# Patient Record
Sex: Female | Born: 1980 | Race: White | Hispanic: No | Marital: Married | Smoking: Never smoker
Health system: Southern US, Community
[De-identification: ages and names within clinical notes are randomized; demographics above are authoritative.]

## PROBLEM LIST (undated history)

## (undated) DIAGNOSIS — E042 Nontoxic multinodular goiter: Secondary | ICD-10-CM

## (undated) DIAGNOSIS — F419 Anxiety disorder, unspecified: Secondary | ICD-10-CM

## (undated) HISTORY — DX: Anxiety disorder, unspecified: F41.9

## (undated) HISTORY — PX: BIOPSY THYROID: PRO38

---

## 2002-11-19 ENCOUNTER — Encounter: Admission: RE | Admit: 2002-11-19 | Discharge: 2002-11-19 | Payer: Self-pay | Admitting: Internal Medicine

## 2002-11-19 ENCOUNTER — Encounter: Payer: Self-pay | Admitting: Internal Medicine

## 2014-07-09 ENCOUNTER — Encounter (HOSPITAL_BASED_OUTPATIENT_CLINIC_OR_DEPARTMENT_OTHER): Payer: Self-pay

## 2014-07-09 ENCOUNTER — Emergency Department (HOSPITAL_BASED_OUTPATIENT_CLINIC_OR_DEPARTMENT_OTHER): Payer: BC Managed Care – PPO

## 2014-07-09 ENCOUNTER — Emergency Department (HOSPITAL_BASED_OUTPATIENT_CLINIC_OR_DEPARTMENT_OTHER)
Admission: EM | Admit: 2014-07-09 | Discharge: 2014-07-09 | Disposition: A | Discharge status: Home / Self Care | Payer: BC Managed Care – PPO | Attending: Emergency Medicine | Admitting: Emergency Medicine

## 2014-07-09 DIAGNOSIS — Z3202 Encounter for pregnancy test, result negative: Secondary | ICD-10-CM | POA: Insufficient documentation

## 2014-07-09 DIAGNOSIS — R52 Pain, unspecified: Secondary | ICD-10-CM

## 2014-07-09 DIAGNOSIS — M79605 Pain in left leg: Secondary | ICD-10-CM

## 2014-07-09 DIAGNOSIS — Z8639 Personal history of other endocrine, nutritional and metabolic disease: Secondary | ICD-10-CM | POA: Diagnosis not present

## 2014-07-09 DIAGNOSIS — R202 Paresthesia of skin: Secondary | ICD-10-CM | POA: Insufficient documentation

## 2014-07-09 HISTORY — DX: Nontoxic multinodular goiter: E04.2

## 2014-07-09 LAB — BASIC METABOLIC PANEL
ANION GAP: 0 — AB (ref 5–15)
BUN: 9 mg/dL (ref 6–23)
CHLORIDE: 107 mmol/L (ref 96–112)
CO2: 28 mmol/L (ref 19–32)
CREATININE: 0.54 mg/dL (ref 0.50–1.10)
Calcium: 9 mg/dL (ref 8.4–10.5)
GFR calc Af Amer: >90 mL/min (ref >90)
GFR calc non Af Amer: >90 mL/min (ref >90)
GLUCOSE: 102 mg/dL — AB (ref 70–99)
Potassium: 4 mmol/L (ref 3.5–5.1)
Sodium: 135 mmol/L (ref 135–145)

## 2014-07-09 LAB — CBC WITH DIFFERENTIAL/PLATELET
BASOS ABS: 0 10*3/uL (ref 0.0–0.1)
BASOS PCT: 0 % (ref 0–1)
Eosinophils Absolute: 0.1 10*3/uL (ref 0.0–0.7)
Eosinophils Relative: 1 % (ref 0–5)
HCT: 39.8 % (ref 36.0–46.0)
Hemoglobin: 13.4 g/dL (ref 12.0–15.0)
Lymphocytes Relative: 20 % (ref 12–46)
Lymphs Abs: 0.9 10*3/uL (ref 0.7–4.0)
MCH: 30.2 pg (ref 26.0–34.0)
MCHC: 33.7 g/dL (ref 30.0–36.0)
MCV: 89.6 fL (ref 78.0–100.0)
Monocytes Absolute: 0.3 10*3/uL (ref 0.1–1.0)
Monocytes Relative: 7 % (ref 3–12)
NEUTROS ABS: 3.2 10*3/uL (ref 1.7–7.7)
NEUTROS PCT: 72 % (ref 43–77)
Platelets: 153 10*3/uL (ref 150–400)
RBC: 4.44 MIL/uL (ref 3.87–5.11)
RDW: 12.3 % (ref 11.5–15.5)
WBC: 4.5 10*3/uL (ref 4.0–10.5)

## 2014-07-09 LAB — URINALYSIS, ROUTINE W REFLEX MICROSCOPIC
Bilirubin Urine: NEGATIVE
GLUCOSE, UA: NEGATIVE mg/dL
Hgb urine dipstick: NEGATIVE
KETONES UR: NEGATIVE mg/dL
LEUKOCYTES UA: NEGATIVE
Nitrite: NEGATIVE
PH: 7 (ref 5.0–8.0)
Protein, ur: NEGATIVE mg/dL
Specific Gravity, Urine: 1.005 (ref 1.005–1.030)
Urobilinogen, UA: 0.2 mg/dL (ref 0.0–1.0)

## 2014-07-09 LAB — PREGNANCY, URINE: PREG TEST UR: NEGATIVE

## 2014-07-09 NOTE — ED Notes (Signed)
Patient transported to Ultrasound 

## 2014-07-09 NOTE — ED Notes (Signed)
Left leg pain and sensation of swelling in posterior thigh x 1 week, worsening the past 2 days. Travels 2 hours in the car daily. Denies SHOB.

## 2014-07-09 NOTE — ED Provider Notes (Signed)
CSN: [id number]     Arrival date & time 07/09/14  [id number] History   First MD Initiated Contact with Patient 07/09/14 [phone]     Chief Complaint  Patient presents with  . Leg Pain    HPI For the last week she has felt that the left leg feels different.  Not really painful.  Since yesterday she has felt that her leg is swollen.  It did not look much different but it felt swollen.  She also noticed a tingling sensation. No weakness.  No fevers.  No chest pain or shortness of breath.  She has been traveling a lot in the car and is concerned about the possibility of a blood clot.  She called her doctor who thought she needed an ultrasound or blood tests but could not see her until 6pm. Past Medical History  Diagnosis Date  . Multiple thyroid nodules    Past Surgical History  Procedure Laterality Date  . Biopsy thyroid     No family history on file. History  Substance Use Topics  . Smoking status: Never Smoker   . Smokeless tobacco: Not on file  . Alcohol Use: No   OB History    No data available     Review of Systems  All other systems reviewed and are negative.     Allergies  Erythromycin  Home Medications   Prior to Admission medications   Medication Sig Start Date End Date Taking? Authorizing Provider  Thyroid (NATURE-THROID PO) Take by mouth.   Yes Historical Provider, MD   BP 120/68 mmHg  Pulse 72  Temp(Src) 98.2 F (36.8 C) (Oral)  Resp 18  Ht 5\' 2"  (1.575 m)  Wt 127 lb (57.607 kg)  BMI 23.22 kg/m2  SpO2 100%  LMP 06/25/2014 Physical Exam  Constitutional: She appears well-developed and well-nourished. No distress.  HENT:  Head: Normocephalic and atraumatic.  Right Ear: External ear normal.  Left Ear: External ear normal.  Eyes: Conjunctivae are normal. Right eye exhibits no discharge. Left eye exhibits no discharge. No scleral icterus.  Neck: Neck supple. No tracheal deviation present.  Cardiovascular: Normal rate.   Pulmonary/Chest: Effort normal. No stridor.  No respiratory distress.  Musculoskeletal: She exhibits no edema.       Lumbar back: Normal.  Neurological: She is alert. Cranial nerve deficit: no gross deficits.  Skin: Skin is warm and dry. No rash noted.  Psychiatric: She has a normal mood and affect.  Nursing note and vitals reviewed.   ED Course  Procedures (including critical care time) Labs Review Labs Reviewed  BASIC METABOLIC PANEL - Abnormal; Notable for the following:    Glucose, Bld 102 (*)    Anion gap 0 (*)    All other components within normal limits  CBC WITH DIFFERENTIAL/PLATELET  URINALYSIS, ROUTINE W REFLEX MICROSCOPIC  PREGNANCY, URINE    Imaging Review [place] Venous Img Lower Unilateral Left  07/09/2014   CLINICAL DATA:  Discomfort of the left posterior thigh and left calf with some swelling. Symptoms of 1 week duration. Frequent traveling by car.  EXAM: Left LOWER EXTREMITY VENOUS DOPPLER ULTRASOUND  TECHNIQUE: Gray-scale sonography with graded compression, as well as color Doppler and duplex ultrasound were performed to evaluate the lower extremity deep venous systems from the level of the common femoral vein and including the common femoral, femoral, profunda femoral, popliteal and calf veins including the posterior tibial, peroneal and gastrocnemius veins when visible. The superficial great saphenous vein was also interrogated. Spectral Doppler was  utilized to evaluate flow at rest and with distal augmentation maneuvers in the common femoral, femoral and popliteal veins.  COMPARISON:  None.  FINDINGS: Contralateral Common Femoral Vein: Respiratory phasicity is normal and symmetric with the symptomatic side. No evidence of thrombus. Normal compressibility.  Common Femoral Vein: No evidence of thrombus. Normal compressibility, respiratory phasicity and response to augmentation.  Saphenofemoral Junction: No evidence of thrombus. Normal compressibility and flow on color Doppler imaging.  Profunda Femoral Vein: No evidence of  thrombus. Normal compressibility and flow on color Doppler imaging.  Femoral Vein: No evidence of thrombus. Normal compressibility, respiratory phasicity and response to augmentation.  Popliteal Vein: No evidence of thrombus. Normal compressibility, respiratory phasicity and response to augmentation.  Calf Veins: No evidence of thrombus. Normal compressibility and flow on color Doppler imaging.  Superficial Great Saphenous Vein: No evidence of thrombus. Normal compressibility and flow on color Doppler imaging.  Venous Reflux:  None.  Other Findings: Survey of the soft tissues of the posterior thigh do not show any abnormal finding.  IMPRESSION: No evidence of deep venous thrombosis.   Electronically Signed   By: Paulina Fusi M.D.   On: 07/09/2014 10:29     MDM   Final diagnoses:  Pain  Pain of left lower extremity    Symptoms may be radicular.  Overall they are very mild.  Pt is reassured that she does not have a DVT.  Follow up with PCP prn   Linwood Dibbles, MD 07/09/14 1240

## 2014-07-09 NOTE — ED Notes (Signed)
Patient returns from Ultrasound. 

## 2014-07-23 ENCOUNTER — Emergency Department (HOSPITAL_BASED_OUTPATIENT_CLINIC_OR_DEPARTMENT_OTHER)
Admission: EM | Admit: 2014-07-23 | Discharge: 2014-07-23 | Disposition: A | Discharge status: Home / Self Care | Payer: BC Managed Care – PPO | Attending: Emergency Medicine | Admitting: Emergency Medicine

## 2014-07-23 ENCOUNTER — Other Ambulatory Visit: Payer: Self-pay

## 2014-07-23 ENCOUNTER — Encounter (HOSPITAL_BASED_OUTPATIENT_CLINIC_OR_DEPARTMENT_OTHER): Payer: Self-pay

## 2014-07-23 ENCOUNTER — Emergency Department (HOSPITAL_BASED_OUTPATIENT_CLINIC_OR_DEPARTMENT_OTHER): Payer: BC Managed Care – PPO

## 2014-07-23 DIAGNOSIS — R079 Chest pain, unspecified: Secondary | ICD-10-CM | POA: Insufficient documentation

## 2014-07-23 DIAGNOSIS — R000 Tachycardia, unspecified: Secondary | ICD-10-CM | POA: Insufficient documentation

## 2014-07-23 DIAGNOSIS — E042 Nontoxic multinodular goiter: Secondary | ICD-10-CM | POA: Diagnosis not present

## 2014-07-23 LAB — COMPREHENSIVE METABOLIC PANEL
ALBUMIN: 4.7 g/dL (ref 3.5–5.2)
ALT: 23 U/L (ref 0–35)
AST: 34 U/L (ref 0–37)
Alkaline Phosphatase: 50 U/L (ref 39–117)
Anion gap: 9 (ref 5–15)
BILIRUBIN TOTAL: 0.7 mg/dL (ref 0.3–1.2)
BUN: 6 mg/dL (ref 6–23)
CHLORIDE: 105 mmol/L (ref 96–112)
CO2: 26 mmol/L (ref 19–32)
CREATININE: 0.63 mg/dL (ref 0.50–1.10)
Calcium: 9.6 mg/dL (ref 8.4–10.5)
GFR calc Af Amer: >90 mL/min (ref >90)
GFR calc non Af Amer: >90 mL/min (ref >90)
Glucose, Bld: 121 mg/dL — ABNORMAL HIGH (ref 70–99)
POTASSIUM: 4.5 mmol/L (ref 3.5–5.1)
SODIUM: 140 mmol/L (ref 135–145)
Total Protein: 7.1 g/dL (ref 6.0–8.3)

## 2014-07-23 LAB — LIPASE, BLOOD: Lipase: 42 U/L (ref 11–59)

## 2014-07-23 LAB — CBC WITH DIFFERENTIAL/PLATELET
Basophils Absolute: 0 10*3/uL (ref 0.0–0.1)
Basophils Relative: 0 % (ref 0–1)
EOS ABS: 0 10*3/uL (ref 0.0–0.7)
Eosinophils Relative: 0 % (ref 0–5)
HEMATOCRIT: 38.5 % (ref 36.0–46.0)
HEMOGLOBIN: 13.1 g/dL (ref 12.0–15.0)
LYMPHS ABS: 0.7 10*3/uL (ref 0.7–4.0)
Lymphocytes Relative: 11 % — ABNORMAL LOW (ref 12–46)
MCH: 30.8 pg (ref 26.0–34.0)
MCHC: 34 g/dL (ref 30.0–36.0)
MCV: 90.4 fL (ref 78.0–100.0)
MONOS PCT: 5 % (ref 3–12)
Monocytes Absolute: 0.3 10*3/uL (ref 0.1–1.0)
NEUTROS PCT: 84 % — AB (ref 43–77)
Neutro Abs: 5.3 10*3/uL (ref 1.7–7.7)
Platelets: 191 10*3/uL (ref 150–400)
RBC: 4.26 MIL/uL (ref 3.87–5.11)
RDW: 12.7 % (ref 11.5–15.5)
WBC: 6.3 10*3/uL (ref 4.0–10.5)

## 2014-07-23 LAB — TROPONIN I

## 2014-07-23 MED ORDER — IOHEXOL 350 MG/ML SOLN
100.0000 mL | Freq: Once | INTRAVENOUS | Status: AC | PRN
  Administered 2014-07-23: 100 mL via INTRAVENOUS

## 2014-07-23 NOTE — ED Notes (Signed)
Pt has taken 81mg  ASA x 2

## 2014-07-23 NOTE — ED Notes (Signed)
Pt reports left sided chest pain. Also sts radiation to left shoulder and in back.

## 2014-07-23 NOTE — ED Provider Notes (Signed)
CSN: [id number]     Arrival date & time 07/23/14  1619 History   First MD Initiated Contact with Patient 07/23/14 1809     Chief Complaint  Patient presents with  . Chest Pain     (Consider location/radiation/quality/duration/timing/severity/associated sxs/prior Treatment) HPI Anne Spencer a 34 year old female who began having some pain under her left breast around noon today. Then began radiating around to behind under her left shoulder blade. She is Anne Spencer describes it as a pressure type pain. His been constant with some times when it worsens and decreases. Appears to worsen with movement. She took aspirin at home without a significant relief. She had some mild dyspnea with deep inspiration. She was seen several weeks ago because she had some swelling in her left lower extremity, however she had a Doppler at that time that did not show any evidence of DVT. She is not currently on any hormones. She has no history of DVT or PE. She does have a family history of coronary artery disease. Not had any fever, nausea, vomiting, chest, or abdominal trauma. Past Medical History  Diagnosis Date  . Multiple thyroid nodules    Past Surgical History  Procedure Laterality Date  . Biopsy thyroid     No family history on file. History  Substance Use Topics  . Smoking status: Never Smoker   . Smokeless tobacco: Not on file  . Alcohol Use: No   OB History    No data available     Review of Systems  All other systems reviewed and are negative.     Allergies  Erythromycin  Home Medications   Prior to Admission medications   Medication Sig Start Date End Date Taking? Authorizing Provider  Thyroid (NATURE-THROID PO) Take by mouth.    Historical Provider, MD   BP 109/67 mmHg  Pulse 93  Temp(Src) 98.5 F (36.9 C) (Oral)  Resp 20  Ht 5\' 2"  (1.575 m)  Wt 125 lb (56.7 kg)  BMI 22.86 kg/m2  SpO2 100%  LMP 07/23/2014 Physical Exam  Constitutional: She is oriented to person, place, and time. She  appears well-developed and well-nourished.  HENT:  Head: Normocephalic and atraumatic.  Right Ear: External ear normal.  Left Ear: External ear normal.  Nose: Nose normal.  Mouth/Throat: Oropharynx is clear and moist.  Eyes: Conjunctivae and EOM are normal. Pupils are equal, round, and reactive to light.  Neck: Normal range of motion. Neck supple.  Cardiovascular: Normal rate, regular rhythm, normal heart sounds and intact distal pulses.   Patient tachycardic during my exam and on monitor. However she becomes normal rate when I'm out of the room.  Pulmonary/Chest: Effort normal and breath sounds normal.  Abdominal: Soft. Bowel sounds are normal.  Musculoskeletal: Normal range of motion.  Neurological: She is alert and oriented to person, place, and time. She has normal reflexes.  Skin: Skin is warm and dry.  Psychiatric: She has a normal mood and affect. Her behavior is normal. Judgment and thought content normal.  Nursing note and vitals reviewed.   ED Course  Procedures (including critical care time) Labs Review Labs Reviewed  CBC WITH DIFFERENTIAL/PLATELET - Abnormal; Notable for the following:    Neutrophils Relative % 84 (*)    Lymphocytes Relative 11 (*)    All other components within normal limits  COMPREHENSIVE METABOLIC PANEL - Abnormal; Notable for the following:    Glucose, Bld 121 (*)    All other components within normal limits  LIPASE, BLOOD  TROPONIN I  Imaging Review Ct Angio Chest Pe W/cm &/or Wo Cm  07/23/2014   CLINICAL DATA:  Chest pain  EXAM: CT ANGIOGRAPHY CHEST WITH CONTRAST  TECHNIQUE: Multidetector CT imaging of the chest was performed using the standard protocol during bolus administration of intravenous contrast. Multiplanar CT image reconstructions and MIPs were obtained to evaluate the vascular anatomy.  CONTRAST:  OMNIPAQUE IOHEXOL 350 MG/ML SOLN  COMPARISON:  None.  FINDINGS: No pulmonary embolism identified. Some of the lower lobe segmental  and subsegmental branches are poorly assessed due to respiratory motion.  Normal caliber aorta and branch vessels.  Normal heart size.  No pleural or pericardial effusion.  No intrathoracic lymphadenopathy. Anterior mediastinal soft tissue favored to reflect residual thymus. Upper abdominal images show nothing acute.  Central airways are patent. No pneumothorax. No confluent airspace opacity or suspicious nodularity. Respiratory motion degrades detail lung parenchymal evaluation within the lower lobes.  No acute osseous finding.  Review of the MIP images confirms the above findings.  IMPRESSION: Some of the lower lobe peripheral branches are poorly assessed due to respiratory motion. Within this limitation, no pulmonary embolism or acute intrathoracic process.   Electronically Signed   By: Jearld Lesch M.D.   On: 07/23/2014 19:33     EKG Interpretation   Date/Time:  Friday July 23 2014 16:31:24 EDT Ventricular Rate:  93 PR Interval:  118 QRS Duration: 72 QT Interval:  362 QTC Calculation: 450 R Axis:   52 Text Interpretation:  Normal sinus rhythm with sinus arrhythmia Normal ECG  Confirmed by Taelar Gronewold MD, Duwayne Heck ([id number]) on 07/23/2014 8:35:28 PM      MDM   Final diagnoses:  Left sided chest pain    34 year old female with side chest pain appears musculoskeletal. She had normal EKG and normal troponins. CT of chest reveals no evidence of pulmonary embolism.    Margarita Grizzle, MD 07/23/14 2038

## 2014-07-23 NOTE — Discharge Instructions (Signed)

## 2014-07-23 NOTE — ED Notes (Signed)
Pt reports chest pain starting around 1pm- worked out this morning without trouble- pain started under left breast and radiates into back- pain at that time was constant and severe- now c/o back pain worsening and discomfort mid chest- pt has hx of clotting issues due to cholestasis during pregnancy

## 2017-03-05 ENCOUNTER — Ambulatory Visit: Payer: BC Managed Care – PPO | Admitting: Neurology

## 2017-03-11 ENCOUNTER — Ambulatory Visit: Payer: BC Managed Care – PPO | Admitting: Neurology

## 2017-03-11 ENCOUNTER — Encounter: Payer: Self-pay | Admitting: Neurology

## 2017-03-11 ENCOUNTER — Encounter (INDEPENDENT_AMBULATORY_CARE_PROVIDER_SITE_OTHER): Payer: Self-pay

## 2017-03-11 VITALS — BP 127/83 | HR 105 | Ht 62.0 in | Wt 120.2 lb

## 2017-03-11 DIAGNOSIS — R202 Paresthesia of skin: Secondary | ICD-10-CM | POA: Diagnosis not present

## 2017-03-11 NOTE — Progress Notes (Signed)
PATIENT: Anne Spencer DOB: 03-Jan-1981  Chief Complaint  Patient presents with  . Headache    Reports getting a headache once a week, at most, that typically resolves with OTC NSAIDS.  She is more concerned about having intermittent "electrical shocking" sensations in her head.  In the last week, she has noticed left-leg heaviness and numbness in tongue.  She also notes that her left ring finger has an odd sensation, as if her movement is delayed.  Marland Kitchen. PCP    Michiel CowboyPerez, Alex, PA-C     HISTORICAL  Anne Spencer is a 36 year old female, seen in refer by  her primary care PA Michiel Cowboyerez, Anne for evaluation of left leg heaviness, numbness in tongue, initial evaluation was on March 11 2017.  I reviewed and summarized referring note, she has past medical history of hypothyroidism, on supplement, anxiety disorder  She reported few months history of intermittent transient sharp pain involving different parts in her skull, no lingering headaches, couple times each day, in addition, she complains of intermittent left hand numbness, weakness, she also have left below knee level heaviness sensation, odd feelings, she denies visual loss.  She had extensive evaluation recently, for constellation of complaints, including headache, paresthesia, MRI of the brain without contrast was normal in July 04 2016, MRI of cervical spine without contrast was normal on July 04 2016,  CT temporal bone without contrast on June 28 2016 that was normal.  REVIEW OF SYSTEMS: Full 14 system review of systems performed and notable only for: Fatigue, blurred vision, cramps, achy muscles, numbness, weakness, dizziness  ALLERGIES: Allergies  Allergen Reactions  . Fluconazole Itching, Rash and Swelling    Other reaction(s): Redness   . Lidocaine Palpitations  . Buspirone Hives  . Erythromycin Rash    HOME MEDICATIONS: Current Outpatient Medications  Medication Sig Dispense Refill  . levothyroxine  (SYNTHROID, LEVOTHROID) 25 MCG tablet Take 12.5 mcg by mouth.     . Multiple Vitamin (MULTIVITAMIN) capsule Take 1 capsule daily by mouth.    . Thyroid (NATURE-THROID PO) Take by mouth.     No current facility-administered medications for this visit.     PAST MEDICAL HISTORY: Past Medical History:  Diagnosis Date  . Anxiety   . Multiple thyroid nodules     PAST SURGICAL HISTORY: Past Surgical History:  Procedure Laterality Date  . BIOPSY THYROID      FAMILY HISTORY: Family History  Problem Relation Age of Onset  . Healthy Mother   . Heart disease Father   . Diabetes Father   . Stroke Father     SOCIAL HISTORY:  Social History   Socioeconomic History  . Marital status: Married    Spouse name: Not on file  . Number of children: 1  . Years of education: 1416  . Highest education level: Bachelor's degree (e.g., BA, AB, BS)  Social Needs  . Financial resource strain: Not on file  . Food insecurity - worry: Not on file  . Food insecurity - inability: Not on file  . Transportation needs - medical: Not on file  . Transportation needs - non-medical: Not on file  Occupational History  . Occupation: Psychologist, clinicalocial research assistant  Tobacco Use  . Smoking status: Never Smoker  . Smokeless tobacco: Never Used  Substance and Sexual Activity  . Alcohol use: No  . Drug use: No  . Sexual activity: Not on file  Other Topics Concern  . Not on file  Social History Narrative  Lives at home with husband and child.   Left-handed.   1 cup caffeine per day.     PHYSICAL EXAM   Vitals:   03/11/17 1315  BP: 127/83  Pulse: (!) 105  Weight: 120 lb 4 oz (54.5 kg)  Height: 5\' 2"  (1.575 m)    Not recorded      Body mass index is 21.99 kg/m.  PHYSICAL EXAMNIATION:  Gen: NAD, conversant, well nourised, obese, well groomed                     Cardiovascular: Regular rate rhythm, no peripheral edema, warm, nontender. Eyes: Conjunctivae clear without exudates or  hemorrhage Neck: Supple, no carotid bruits. Pulmonary: Clear to auscultation bilaterally   NEUROLOGICAL EXAM:  MENTAL STATUS: Speech:    Speech is normal; fluent and spontaneous with normal comprehension.  Cognition:     Orientation to time, place and person     Normal recent and remote memory     Normal Attention span and concentration     Normal Language, naming, repeating,spontaneous speech     Fund of knowledge   CRANIAL NERVES: CN II: Visual fields are full to confrontation. Fundoscopic exam is normal with sharp discs and no vascular changes. Pupils are round equal and briskly reactive to light. CN III, IV, VI: extraocular movement are normal. No ptosis. CN V: Facial sensation is intact to pinprick in all 3 divisions bilaterally. Corneal responses are intact.  CN VII: Face is symmetric with normal eye closure and smile. CN VIII: Hearing is normal to rubbing fingers CN IX, X: Palate elevates symmetrically. Phonation is normal. CN XI: Head turning and shoulder shrug are intact CN XII: Tongue is midline with normal movements and no atrophy.  MOTOR: There is no pronator drift of out-stretched arms. Muscle bulk and tone are normal. Muscle strength is normal.  REFLEXES: Reflexes are 2+ and symmetric at the biceps, triceps, knees, and ankles. Plantar responses are flexor.  SENSORY: Intact to light touch, pinprick, positional sensation and vibratory sensation are intact in fingers and toes.  COORDINATION: Rapid alternating movements and fine finger movements are intact. There is no dysmetria on finger-to-nose and heel-knee-shin.    GAIT/STANCE: Posture is normal. Gait is steady with normal steps, base, arm swing, and turning. Heel and toe walking are normal. Tandem gait is normal.  Romberg is absent.   DIAGNOSTIC DATA (LABS, IMAGING, TESTING) - I reviewed patient records, labs, notes, testing and imaging myself where available.   ASSESSMENT AND PLAN  Anne Spencer is  a 36 y.o. female  Constellation of complaints, History of anxiety  Extensive evaluations in the past, including normal MRI of the brain twice, normal MRI of the cervical,  Laboratory evaluations including thyroid functional tasks,  I will call her laboratory result,   Levert Feinstein, M.D. Ph.D.  [facility] [address] [place], [place] [address] Ph: [phone] Fax: [phone]  CC: Michiel Cowboy, PA-C

## 2017-03-12 ENCOUNTER — Telehealth: Payer: Self-pay | Admitting: *Deleted

## 2017-03-12 LAB — COMPREHENSIVE METABOLIC PANEL
ALK PHOS: 49 IU/L (ref 39–117)
ALT: 21 IU/L (ref 0–32)
AST: 20 IU/L (ref 0–40)
Albumin/Globulin Ratio: 2.7 — ABNORMAL HIGH (ref 1.2–2.2)
Albumin: 5.1 g/dL (ref 3.5–5.5)
BUN / CREAT RATIO: 14 (ref 9–23)
BUN: 8 mg/dL (ref 6–20)
Bilirubin Total: 0.6 mg/dL (ref 0.0–1.2)
CO2: 19 mmol/L — ABNORMAL LOW (ref 20–29)
CREATININE: 0.59 mg/dL (ref 0.57–1.00)
Calcium: 9.3 mg/dL (ref 8.7–10.2)
Chloride: 100 mmol/L (ref 96–106)
GFR calc non Af Amer: 117 mL/min/{1.73_m2} (ref >59)
GFR, EST AFRICAN AMERICAN: 135 mL/min/{1.73_m2} (ref >59)
GLOBULIN, TOTAL: 1.9 g/dL (ref 1.5–4.5)
Glucose: 75 mg/dL (ref 65–99)
Potassium: 4.7 mmol/L (ref 3.5–5.2)
SODIUM: 141 mmol/L (ref 134–144)
TOTAL PROTEIN: 7 g/dL (ref 6.0–8.5)

## 2017-03-12 LAB — CBC WITH DIFFERENTIAL
Basophils Absolute: 0 10*3/uL (ref 0.0–0.2)
Basos: 1 %
EOS (ABSOLUTE): 0 10*3/uL (ref 0.0–0.4)
EOS: 1 %
Hematocrit: 38.3 % (ref 34.0–46.6)
Hemoglobin: 13 g/dL (ref 11.1–15.9)
Immature Grans (Abs): 0 10*3/uL (ref 0.0–0.1)
Immature Granulocytes: 0 %
Lymphocytes Absolute: 0.7 10*3/uL (ref 0.7–3.1)
Lymphs: 12 %
MCH: 29.6 pg (ref 26.6–33.0)
MCHC: 33.9 g/dL (ref 31.5–35.7)
MCV: 87 fL (ref 79–97)
MONOS ABS: 0.4 10*3/uL (ref 0.1–0.9)
Monocytes: 6 %
NEUTROS ABS: 5.1 10*3/uL (ref 1.4–7.0)
NEUTROS PCT: 80 %
RBC: 4.39 x10E6/uL (ref 3.77–5.28)
RDW: 14.5 % (ref 12.3–15.4)
WBC: 6.3 10*3/uL (ref 3.4–10.8)

## 2017-03-12 LAB — TSH: TSH: 2.77 u[IU]/mL (ref 0.450–4.500)

## 2017-03-12 NOTE — Telephone Encounter (Signed)
Spoke to patient she is aware of the results ?

## 2017-03-12 NOTE — Telephone Encounter (Signed)
-----   Message from Levert FeinsteinYijun Yan, MD sent at 03/12/2017  1:28 PM EST ----- Please call patient: No significant abnormality laboratory evaluations

## 2017-04-22 ENCOUNTER — Ambulatory Visit: Payer: BC Managed Care – PPO | Admitting: Neurology

## 2019-01-15 ENCOUNTER — Ambulatory Visit (INDEPENDENT_AMBULATORY_CARE_PROVIDER_SITE_OTHER): Payer: BC Managed Care – PPO | Admitting: Sports Medicine

## 2019-01-15 ENCOUNTER — Ambulatory Visit (INDEPENDENT_AMBULATORY_CARE_PROVIDER_SITE_OTHER): Payer: BC Managed Care – PPO

## 2019-01-15 ENCOUNTER — Other Ambulatory Visit: Payer: Self-pay

## 2019-01-15 ENCOUNTER — Encounter: Payer: Self-pay | Admitting: Sports Medicine

## 2019-01-15 DIAGNOSIS — M5136 Other intervertebral disc degeneration, lumbar region with discogenic back pain only: Secondary | ICD-10-CM | POA: Insufficient documentation

## 2019-01-15 DIAGNOSIS — M5412 Radiculopathy, cervical region: Secondary | ICD-10-CM

## 2019-01-15 MED ORDER — CYCLOBENZAPRINE HCL 10 MG PO TABS: ORAL_TABLET | ORAL | 0 refills | Status: DC

## 2019-01-15 MED ORDER — MELOXICAM 15 MG PO TABS: ORAL_TABLET | ORAL | 3 refills | Status: DC

## 2019-01-15 NOTE — Progress Notes (Signed)
Subjective:    CC: Neck pain, back pain  HPI:  This is a pleasant 38 year old female, for some time now she is noted pain in her neck, worse with prolonged downgaze, with radiating numbness into her fourth and fifth fingers.  Moderate, persistent, no trauma, no constitutional symptoms, she also has some tingling in both feet, mild low back pain.  Axial, discogenic.  She did have some labs done that showed an elevated CRP, neutrophil count, but she did just have extensive dental work.  No fevers, chills, night sweats, weight loss.  I reviewed the past medical history, family history, social history, surgical history, and allergies today and no changes were needed.  Please see the problem list section below in epic for further details.  Past Medical History: Past Medical History:  Diagnosis Date  . Anxiety   . Multiple thyroid nodules    Past Surgical History: Past Surgical History:  Procedure Laterality Date  . BIOPSY THYROID     Social History: Social History   Socioeconomic History  . Marital status: Married    Spouse name: Not on file  . Number of children: 1  . Years of education: 7716  . Highest education level: Bachelor's degree (e.g., BA, AB, BS)  Occupational History  . Occupation: Psychologist, clinicalocial research assistant  Social Needs  . Financial resource strain: Not on file  . Food insecurity    Worry: Not on file    Inability: Not on file  . Transportation needs    Medical: Not on file    Non-medical: Not on file  Tobacco Use  . Smoking status: Never Smoker  . Smokeless tobacco: Never Used  Substance and Sexual Activity  . Alcohol use: No  . Drug use: No  . Sexual activity: Not on file  Lifestyle  . Physical activity    Days per week: Not on file    Minutes per session: Not on file  . Stress: Not on file  Relationships  . Social Musicianconnections    Talks on phone: Not on file    Gets together: Not on file    Attends religious service: Not on file    Active member of  club or organization: Not on file    Attends meetings of clubs or organizations: Not on file    Relationship status: Not on file  Other Topics Concern  . Not on file  Social History Narrative   Lives at home with husband and child.   Left-handed.   1 cup caffeine per day.   Family History: Family History  Problem Relation Age of Onset  . Healthy Mother   . Heart disease Father   . Diabetes Father   . Stroke Father    Allergies: Allergies  Allergen Reactions  . Fluconazole Itching, Rash and Swelling    Other reaction(s): Redness   . Lidocaine Palpitations  . Buspirone Hives  . Erythromycin Rash   Medications: See med rec.  Review of Systems: No headache, visual changes, nausea, vomiting, diarrhea, constipation, dizziness, abdominal pain, skin rash, fevers, chills, night sweats, swollen lymph nodes, weight loss, chest pain, body aches, joint swelling, muscle aches, shortness of breath, mood changes, visual or auditory hallucinations.  Objective:    General: Well Developed, well nourished, and in no acute distress.  Neuro: Alert and oriented x3, extra-ocular muscles intact, sensation grossly intact.  HEENT: Normocephalic, atraumatic, pupils equal round reactive to light, neck supple, no masses, no lymphadenopathy, thyroid nonpalpable.  Skin: Warm and dry, no rashes  noted.  Cardiac: Regular rate and rhythm, no murmurs rubs or gallops.  Respiratory: Clear to auscultation bilaterally. Not using accessory muscles, speaking in full sentences.  Abdominal: Soft, nontender, nondistended, positive bowel sounds, no masses, no organomegaly.  Neck: Negative spurling's Full neck range of motion Grip strength and sensation normal in bilateral hands Strength good C4 to T1 distribution No sensory change to C4 to T1 Reflexes normal Back Exam:  Inspection: Unremarkable  Motion: Flexion 45 deg, Extension 45 deg, Side Bending to 45 deg bilaterally,  Rotation to 45 deg bilaterally  SLR  laying: Negative  XSLR laying: Negative  Palpable tenderness: None. FABER: negative. Sensory change: Gross sensation intact to all lumbar and sacral dermatomes.  Reflexes: 2+ at both patellar tendons, 2+ at achilles tendons, Babinski's downgoing.  Strength at foot  Plantar-flexion: 5/5 Dorsi-flexion: 5/5 Eversion: 5/5 Inversion: 5/5  Leg strength  Quad: 5/5 Hamstring: 5/5 Hip flexor: 5/5 Hip abductors: 5/5  Gait unremarkable.   Impression and Recommendations:    The patient was counselled, risk factors were discussed, anticipatory guidance given.  Discogenic low back pain With bilateral paresthesias into both feet. Normal hemoglobin A1c. Meloxicam, Flexeril, formal PT, lumbar spine x-rays. If no better in 6 weeks we will proceed with lumbar spine MRI and possibly nerve conduction study.  Radiculitis of left cervical region Left C8 distribution radiculitis. Adding x-rays, meloxicam, Flexeril. Formal PT. Return to see me in 6 weeks, MRI for interventional planning if no better.   ___________________________________________ Gwen Her. Dianah Field, M.D., ABFM., CAQSM. Primary Care and Sports Medicine [facility]  Adjunct Professor of [employer]

## 2019-01-15 NOTE — Assessment & Plan Note (Signed)
With bilateral paresthesias into both feet. Normal hemoglobin A1c. Meloxicam, Flexeril, formal PT, lumbar spine x-rays. If no better in 6 weeks we will proceed with lumbar spine MRI and possibly nerve conduction study.

## 2019-01-15 NOTE — Assessment & Plan Note (Signed)
Left C8 distribution radiculitis. Adding x-rays, meloxicam, Flexeril. Formal PT. Return to see me in 6 weeks, MRI for interventional planning if no better.

## 2019-01-21 ENCOUNTER — Ambulatory Visit (INDEPENDENT_AMBULATORY_CARE_PROVIDER_SITE_OTHER): Payer: BC Managed Care – PPO | Admitting: Physical Therapy

## 2019-01-21 ENCOUNTER — Other Ambulatory Visit: Payer: Self-pay

## 2019-01-21 ENCOUNTER — Encounter: Payer: Self-pay | Admitting: Physical Therapy

## 2019-01-21 DIAGNOSIS — M542 Cervicalgia: Secondary | ICD-10-CM | POA: Diagnosis not present

## 2019-01-21 DIAGNOSIS — R293 Abnormal posture: Secondary | ICD-10-CM

## 2019-01-21 DIAGNOSIS — M5442 Lumbago with sciatica, left side: Secondary | ICD-10-CM

## 2019-01-21 NOTE — Patient Instructions (Signed)
Access Code: [place]  URL: [email or web address]  Date: 01/21/2019  Prepared by: Faustino Congress   Exercises  Seated Upper Trapezius Stretch - 3 reps - 1 sets - 30 sec hold - 2x daily - 7x weekly  Seated Levator Scapulae Stretch - 1 reps - 1 sets - 30 sec hold - 2x daily - 7x weekly  Patient Education  Trigger Point Dry Needling

## 2019-01-21 NOTE — Therapy (Signed)
[facility] [address] [place], [place], [address] Phone: [phone]   Fax:  [phone]  Physical Therapy Evaluation  Patient Details  Name: Anne Spencer MRN: [id number] Date of Birth: 07-19-1980 Referring Provider (PT): Monica Becton, MD   Encounter Date: 01/21/2019  PT End of Session - 01/21/19 0844    Visit Number  1    Number of Visits  12    Date for PT Re-Evaluation  03/04/19    PT Start Time  0801    PT Stop Time  0840    PT Time Calculation (min)  39 min    Activity Tolerance  Patient tolerated treatment well    Behavior During Therapy  [facility] for tasks assessed/performed       Past Medical History:  Diagnosis Date  . Anxiety   . Multiple thyroid nodules     Past Surgical History:  Procedure Laterality Date  . BIOPSY THYROID      There were no vitals filed for this visit.   Subjective Assessment - 01/21/19 0803    Subjective  Pt is a 38 y/o female who presents to OPPT for sudden onset of tingling in her feet and neck pain x 1 month.  Pt had work up for diabetes which was ruled out.  Pt reports pain in neck and low back, and referred to sports medicine MD.  Montez Morita showed DDD    Diagnostic tests  X-rays showed DDD    Patient Stated Goals  improve neck pain, and lower back pain    Currently in Pain?  Yes    Pain Score  2    up to 8/10   Pain Location  Neck    Pain Orientation  Right;Posterior;Mid    Pain Descriptors / Indicators  Sore;Tender;Aching;Dull    Pain Radiating Towards  into head; occasional tingling into Lt foot    Pain Onset  More than a month ago    Pain Frequency  Intermittent    Aggravating Factors   movement, lying flat    Pain Relieving Factors  meds         [facility] PT Assessment - 01/21/19 0810      Assessment   Medical Diagnosis  Left cervical radiculitis, lumbar DDD    Referring Provider (PT)  Monica Becton, MD    Onset Date/Surgical Date  --   1  month   Hand Dominance  Left    Next MD Visit  02/26/19    Prior Therapy  none      Precautions   Precautions  None      Restrictions   Weight Bearing Restrictions  No      Balance Screen   Has the patient fallen in the past 6 months  No    Has the patient had a decrease in activity level because of a fear of falling?   No    Is the patient reluctant to leave their home because of a fear of falling?   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; currently working from home    Leisure  spend time with child; stopped exercise in  Jan ([employer])      Cognition   Overall Cognitive Status  Within Functional Limits for tasks assessed      Observation/Other Assessments   Focus on Therapeutic Outcomes (FOTO)   94 (6% limited; predicted 6% limited)      Posture/Postural Control   Posture/Postural Control  Postural limitations    Postural Limitations  Rounded Shoulders;Forward head;Increased thoracic kyphosis      ROM / Strength   AROM / PROM / Strength  AROM;Strength      AROM   AROM Assessment Site  Cervical    Cervical Flexion  51   pulling pain   Cervical Extension  44    Cervical - Right Side Bend  38    Cervical - Left Side Bend  38    Cervical - Right Rotation  60   with pain   Cervical - Left Rotation  75      Strength   Strength Assessment Site  Shoulder;Elbow    Right/Left Shoulder  Right;Left    Right Shoulder Flexion  4/5    Right Shoulder ABduction  5/5    Right Shoulder Internal Rotation  5/5    Right Shoulder External Rotation  5/5    Left Shoulder Flexion  5/5    Left Shoulder ABduction  5/5    Left Shoulder Internal Rotation  5/5    Left Shoulder External Rotation  5/5     Right/Left Elbow  Right;Left    Right Elbow Flexion  5/5    Right Elbow Extension  5/5    Left Elbow Flexion  5/5    Left Elbow Extension  5/5      Palpation   Palpation comment  trigger points cervical paraspinals, upper traps, levator scapula and suboccipitals      Special Tests    Special Tests  Cervical    Cervical Tests  Spurling's;Dictraction      Spurling's   Findings  Negative      Distraction Test   Findngs  Negative                Objective measurements completed on examination: See above findings.      [facility] Adult PT Treatment/Exercise - 01/21/19 0810      Self-Care   Self-Care  Other Self-Care Comments    Other Self-Care Comments   instructed in HEP - upper trap and levator stretches: pt performed x 1 rep each      Exercises   Exercises  Neck      Manual Therapy   Manual Therapy  Soft tissue mobilization    Soft tissue mobilization  cervical paraspinals and sub occipitals      Neck Exercises: Stretches   Upper Trapezius Stretch  Right;1 rep;30 seconds    Levator Stretch  Right;1 rep;30 seconds       Trigger Point Dry Needling - 01/21/19 0001    Consent Given?  Yes    Education Handout Provided  Yes    Muscles Treated Head and Neck  Oblique capitus;Suboccipitals;Splenius capitus;Semispinalis capitus    Oblique Capitus Response  Twitch response elicited;Palpable increased muscle length    SubOccipitals Response  Twitch response elicited;Palpable increased muscle length    Splenius capitus Response  Twitch reponse elicited;Palpable increased muscle length    Semispinalis capitus Response  Twitch reponse elicited;Palpable increased muscle length           PT Education - 01/21/19 0844    Education Details  HEP, DN  Person(s) Educated  Patient    Methods  Explanation;Demonstration;Handout    Comprehension  Verbalized understanding;Returned demonstration;Need further instruction          PT Long Term Goals - 01/21/19 0947      PT  LONG TERM GOAL #1   Title  independent with HEP    Status  New    Target Date  03/04/19      PT LONG TERM GOAL #2   Title  improve Rt cervical rotation to at least 75 degrees for improved mobility    Status  New    Target Date  03/04/19      PT LONG TERM GOAL #3   Title  report pain < 4/10 with sleeping/lying flat for improved function    Status  New    Target Date  03/04/19      PT LONG TERM GOAL #4   Title  verbalize understanding of postural awareness to decrease risk of reinjury    Status  New    Target Date  03/04/19             Plan - 01/21/19 0844    Clinical Impression Statement  Pt is a 38 y/o female who presents to OPPT with c/o Rt sided cervical pain as well as LBP.  Pt requested focus on neck pain today, and will plan to address LBP PRN at a later date.  Pt demonstrates decreased cervical rotation, mild strength deficits, postural abnormalities and active trigger points affecting functional mobility.  Pt will benefit from PT to address deficits listed.    Examination-Activity Limitations  Bed Mobility;Sleep    Examination-Participation Restrictions  --   work   Stability/Clinical Decision Making  Stable/Uncomplicated    Clinical Decision Making  Low    Rehab Potential  Good    PT Frequency  2x / week   1-2x/wk   PT Duration  6 weeks    PT Treatment/Interventions  ADLs/Self Care Home Management;Cryotherapy;Electrical Stimulation;Ultrasound;Traction;Moist Heat;Iontophoresis 4mg /ml Dexamethasone;Functional mobility training;Therapeutic activities;Therapeutic exercise;Patient/family education;Manual techniques;Passive range of motion;Taping;Dry needling    PT Next Visit Plan  review HEP, progress posture exercises, assess response to DN and continue with manual/modalities/DN PRN    PT Home Exercise Plan  Access Code: [email or web address]    Consulted and Agree with Plan of Care  Patient       Patient will benefit from skilled therapeutic intervention in order to improve the  following deficits and impairments:  Decreased range of motion, Increased fascial restricitons, Increased muscle spasms, Pain, Decreased strength, Postural dysfunction  Visit Diagnosis: Cervicalgia - Plan: PT plan of care cert/re-cert  Abnormal posture - Plan: PT plan of care cert/re-cert  Acute left-sided low back pain with left-sided sciatica - Plan: PT plan of care cert/re-cert     Problem List Patient Active Problem List   Diagnosis Date Noted  . Radiculitis of left cervical region 01/15/2019  . Discogenic low back pain 01/15/2019  . Paresthesia 03/11/2017      Clarita CraneStephanie F Jaeshaun Riva, PT, DPT 01/21/19 9:50 AM     [facility] [address] [place], [place], [address] Phone: [phone]   Fax:  [phone]  Name: Anne Spencer MRN: [id number] Date of Birth: [birth year]

## 2019-01-27 ENCOUNTER — Ambulatory Visit (INDEPENDENT_AMBULATORY_CARE_PROVIDER_SITE_OTHER): Payer: BC Managed Care – PPO | Admitting: Physical Therapy

## 2019-01-27 ENCOUNTER — Other Ambulatory Visit: Payer: Self-pay

## 2019-01-27 ENCOUNTER — Encounter: Payer: Self-pay | Admitting: Physical Therapy

## 2019-01-27 DIAGNOSIS — R293 Abnormal posture: Secondary | ICD-10-CM

## 2019-01-27 DIAGNOSIS — M5442 Lumbago with sciatica, left side: Secondary | ICD-10-CM | POA: Diagnosis not present

## 2019-01-27 DIAGNOSIS — M542 Cervicalgia: Secondary | ICD-10-CM | POA: Diagnosis not present

## 2019-01-27 NOTE — Patient Instructions (Signed)
Access Code: [email or web address]  URL: [email or web address]  Date: 01/27/2019  Prepared by: Faustino Congress   Exercises  Seated Upper Trapezius Stretch - 3 reps - 1 sets - 30 sec hold - 2x daily - 7x weekly  Seated Levator Scapulae Stretch - 1 reps - 1 sets - 30 sec hold - 2x daily - 7x weekly   Today: Seated Scapular Retraction - 10 reps - 1 sets - 5 sec hold - 2x daily - 7x weekly  Standing Backward Shoulder Rolls - 10 reps - 1 sets - 2x daily - 7x weekly  Patient Education  Trigger Point Dry Needling

## 2019-01-27 NOTE — Therapy (Signed)
[facility] [address] , [place], [address] Phone: [phone]   Fax:  [phone]  Physical Therapy Treatment  Patient Details  Name: Anne Spencer MRN: [id number] Date of Birth: 07/01/1980 Referring Provider (PT): Thekkekandam, Thomas J, MD   Encounter Date: 01/27/2019  PT End of Session - 01/27/19 1141    Visit Number  2    Number of Visits  12    Date for PT Re-Evaluation  03/04/19    PT Start Time  1100    PT Stop Time  1140    PT Time Calculation (min)  40 min    Activity Tolerance  Patient tolerated treatment well    Behavior During Therapy  WFL for tasks assessed/performed       Past Medical History:  Diagnosis Date  . Anxiety   . Multiple thyroid nodules     Past Surgical History:  Procedure Laterality Date  . BIOPSY THYROID      There were no vitals filed for this visit.  Subjective Assessment - 01/27/19 1058    Subjective  felt a little better after the DN; but then started having increased pain. mobic seemed to cause diarrhea.    Diagnostic tests  X-rays showed DDD    Patient Stated Goals  improve neck pain, and lower back pain    Currently in Pain?  Yes    Pain Score  5     Pain Location  Neck    Pain Orientation  Right;Posterior;Mid    Pain Descriptors / Indicators  Pressure;Sore    Pain Type  Acute pain    Pain Onset  More than a month ago    Pain Frequency  Intermittent    Aggravating Factors   movement, lying flat    Pain Relieving Factors  meds                       [facility] Adult PT Treatment/Exercise - 01/27/19 1105      Neck Exercises: Seated   Shoulder Rolls  Backwards;15 reps    Other Seated Exercise  scapular retraction x 10 reps; 10 sec hold      Manual Therapy   Manual Therapy  Soft tissue mobilization    Soft tissue mobilization  cervical paraspinals, upper traps and sub occipitals      Neck Exercises: Stretches   Upper Trapezius Stretch   Right;30 seconds;2 reps    Levator Stretch  Right;30 seconds;2 reps    Other Neck Stretches  chest stretch 3x 15 sec       Trigger Point Dry Needling - 01/27/19 1133    Consent Given?  Yes    Education Handout Provided  Previously provided    Muscles Treated Head and Neck  Oblique capitus;Suboccipitals;Splenius capitus;Semispinalis capitus;Upper trapezius    Upper Trapezius Response  Twitch reponse elicited;Palpable increased muscle length    Oblique Capitus Response  Twitch response elicited;Palpable increased muscle length    SubOccipitals Response  Twitch response elicited;Palpable increased muscle length    Splenius capitus Response  Twitch reponse elicited;Palpable increased muscle length    Semispinalis capitus Response  Twitch reponse elicited;Palpable increased muscle length           PT Education - 01/27/19 1141    Education Details  updated HEP    Person(s) Educated  Patient    Methods  Explanation;Demonstration;Handout    Comprehension  Verbalized understanding;Returned demonstration            PT Long Term Goals - 01/21/19 0947      PT LONG TERM GOAL #1   Title  independent with HEP    Status  New    Target Date  03/04/19      PT LONG TERM GOAL #2   Title  improve Rt cervical rotation to at least 75 degrees for improved mobility    Status  New    Target Date  03/04/19      PT LONG TERM GOAL #3   Title  report pain < 4/10 with sleeping/lying flat for improved function    Status  New    Target Date  03/04/19      PT LONG TERM GOAL #4   Title  verbalize understanding of postural awareness to decrease risk of reinjury    Status  New    Target Date  03/04/19            Plan - 01/27/19 1141    Clinical Impression Statement  Pt with return of pain, but positive response initially with DN and manual therapy.  Progressed HEP today.  No goals met as only 2nd visit.    Examination-Activity Limitations  Bed Mobility;Sleep    Examination-Participation  Restrictions  --   work   Stability/Clinical Decision Making  Stable/Uncomplicated    Rehab Potential  Good    PT Frequency  2x / week   1-2x/wk   PT Duration  6 weeks    PT Treatment/Interventions  ADLs/Self Care Home Management;Cryotherapy;Electrical Stimulation;Ultrasound;Traction;Moist Heat;Iontophoresis 33m/ml Dexamethasone;Functional mobility training;Therapeutic activities;Therapeutic exercise;Patient/family education;Manual techniques;Passive range of motion;Taping;Dry needling    PT Next Visit Plan  review HEP, progress posture exercises, assess response to DN and continue with manual/modalities/DN PRN    PT Home Exercise Plan  Access Code: [email or web address]    Consulted and Agree with Plan of Care  Patient       Patient will benefit from skilled therapeutic intervention in order to improve the following deficits and impairments:  Decreased range of motion, Increased fascial restricitons, Increased muscle spasms, Pain, Decreased strength, Postural dysfunction  Visit Diagnosis: Cervicalgia  Abnormal posture  Acute left-sided low back pain with left-sided sciatica     Problem List Patient Active Problem List   Diagnosis Date Noted  . Radiculitis of left cervical region 01/15/2019  . Discogenic low back pain 01/15/2019  . Paresthesia 03/11/2017      SLaureen Abrahams PT, DPT 01/27/19 11:42 AM     [facility] [address]Phone: [phone]  Fax:  [phone] Name: Anne GIELMRN: [id number] of Birth: 928-Jun-1982

## 2019-02-03 ENCOUNTER — Encounter: Payer: Self-pay | Admitting: Physical Therapy

## 2019-02-03 ENCOUNTER — Other Ambulatory Visit: Payer: Self-pay

## 2019-02-03 ENCOUNTER — Ambulatory Visit (INDEPENDENT_AMBULATORY_CARE_PROVIDER_SITE_OTHER): Payer: BC Managed Care – PPO | Admitting: Physical Therapy

## 2019-02-03 DIAGNOSIS — M542 Cervicalgia: Secondary | ICD-10-CM | POA: Diagnosis not present

## 2019-02-03 DIAGNOSIS — R293 Abnormal posture: Secondary | ICD-10-CM

## 2019-02-03 NOTE — Therapy (Signed)
[facility] [address] [place], [place], [address] Phone: [phone]   Fax:  [phone]  Physical Therapy Treatment  Patient Details  Name: Anne Spencer MRN: [id number] Date of Birth: 03-01-1981 Referring Provider (PT): Monica Becton, MD   Encounter Date: 02/03/2019  PT End of Session - 02/03/19 1019    Visit Number  3    Number of Visits  12    Date for PT Re-Evaluation  03/04/19    PT Start Time  1019    PT Stop Time  1104    PT Time Calculation (min)  45 min       Past Medical History:  Diagnosis Date  . Anxiety   . Multiple thyroid nodules     Past Surgical History:  Procedure Laterality Date  . BIOPSY THYROID      There were no vitals filed for this visit.  Subjective Assessment - 02/03/19 1257    Subjective  Pt reports she has had great response to DN last 2 sessions, but relief only lasts a few days.    Currently in Pain?  Yes    Pain Score  6     Pain Location  Neck    Pain Orientation  Right;Posterior    Pain Descriptors / Indicators  Tightness;Sore    Pain Radiating Towards  into Rt ring finger and pinky    Aggravating Factors   ?    Pain Relieving Factors  DN, meds         [facility] PT Assessment - 02/03/19 0001      Assessment   Medical Diagnosis  Left cervical radiculitis, lumbar DDD    Referring Provider (PT)  Monica Becton, MD    Onset Date/Surgical Date  --   1 month   Hand Dominance  Left    Next MD Visit  02/26/19    Prior Therapy  none        [facility] Adult PT Treatment/Exercise - 02/03/19 0001      Self-Care   Self-Care  Other Self-Care Comments    Other Self-Care Comments   Pt educated on TENS rationale and safety.  Pt educated on self MFR to Rt platysma and suboccipital release with balls/blocks.       Neck Exercises: Standing   Other Standing Exercises  scap retraction x 5 sec x 10; W's x 3 sec x 10       Neck Exercises: Seated   Neck Retraction   5 reps;3 secs    W Back  10 reps   5 sec hold   Shoulder Rolls  Backwards;5 reps    Other Seated Exercise  thoracic ext over back of chair x 3 reps of 5 sec    Other Seated Exercise  ulnar nerve glide RUE x 10 reps (gradual progress- bird man pose)      Neck Exercises: Supine   Neck Retraction  5 reps;3 secs    Other Supine Exercise  scap retraction x 5 reps of 5 sec      Modalities   Modalities  Electrical Stimulation;Moist Heat      Moist Heat Therapy   Number Minutes Moist Heat  10 Minutes    Moist Heat Location  Cervical      Electrical Stimulation   Electrical Stimulation Location  bilat cervical paraspinals and Rt upper trap     Electrical Stimulation Action  TENS    Electrical Stimulation Parameters  to tolerance  Electrical Stimulation Goals  Pain      Manual Therapy   Manual Therapy  Soft tissue mobilization    Soft tissue mobilization  IASTM to Rt upper trap and neck      Neck Exercises: Stretches   Upper Trapezius Stretch  Right;2 reps   towel anchor over shoulder   Levator Stretch  Right;1 rep;10 seconds    Other Neck Stretches  mid level doorway stretch x 20 sec x 2 reps - cues for form              PT Education - 02/03/19 1301    Education Details  HEP, TENS info    Person(s) Educated  Patient    Methods  Explanation;Demonstration;Handout    Comprehension  Returned demonstration;Verbalized understanding          PT Long Term Goals - 01/21/19 0947      PT LONG TERM GOAL #1   Title  independent with HEP    Status  New    Target Date  03/04/19      PT LONG TERM GOAL #2   Title  improve Rt cervical rotation to at least 75 degrees for improved mobility    Status  New    Target Date  03/04/19      PT LONG TERM GOAL #3   Title  report pain < 4/10 with sleeping/lying flat for improved function    Status  New    Target Date  03/04/19      PT LONG TERM GOAL #4   Title  verbalize understanding of postural awareness to decrease risk of  reinjury    Status  New    Target Date  03/04/19            Plan - 02/03/19 1259    Clinical Impression Statement  Pt has had good response to manual therapy/ DN, but pain returns.  TIme spent educating pt regarding posture/ positioning and the affects on neck tightness/ nerve symptoms. Included more postural strengthening and nerve glides into HEP.   Ulnar nerve glide increased sensation in her Rt 5th finger; encouraged her to gradually increase glide as tolerated.  Goals are ongoing at this time.    Examination-Activity Limitations  Bed Mobility;Sleep    Examination-Participation Restrictions  --   work   Stability/Clinical Decision Making  Stable/Uncomplicated    Rehab Potential  Good    PT Frequency  2x / week   1-2x/wk   PT Duration  6 weeks    PT Treatment/Interventions  ADLs/Self Care Home Management;Cryotherapy;Electrical Stimulation;Ultrasound;Traction;Moist Heat;Iontophoresis 4mg /ml Dexamethasone;Functional mobility training;Therapeutic activities;Therapeutic exercise;Patient/family education;Manual techniques;Passive range of motion;Taping;Dry needling    PT Next Visit Plan  review HEP, progress posture exercises, assess response to DN and continue with manual/modalities/DN PRN    PT Home Exercise Plan  Access Code: [email or web address]    Consulted and Agree with Plan of Care  Patient       Patient will benefit from skilled therapeutic intervention in order to improve the following deficits and impairments:  Decreased range of motion, Increased fascial restricitons, Increased muscle spasms, Pain, Decreased strength, Postural dysfunction  Visit Diagnosis: Cervicalgia  Abnormal posture     Problem List Patient Active Problem List   Diagnosis Date Noted  . Radiculitis of left cervical region 01/15/2019  . Discogenic low back pain 01/15/2019  . Paresthesia 03/11/2017   Kerin Perna, PTA 02/03/19 1:01 PM  [address] [place],  [place], [id number] Phone: [phone]   Fax:  [phone]  Name: Anne Spencer MRN: [id number] Date of Birth: April 25, 1981

## 2019-02-03 NOTE — Patient Instructions (Signed)
TENS UNIT: This is helpful for muscle pain and spasm.   Search and Purchase a TENS 7000 2nd edition at www.amazon.com    TENS unit instructions: Do not shower or bathe with the unit on Turn the unit off before removing electrodes or batteries If the electrodes lose stickiness add a drop of water to the electrodes after they are disconnected from the unit and place on plastic sheet. If you continued to have difficulty, call the TENS unit company to purchase more electrodes. Do not apply lotion on the skin area prior to use. Make sure the skin is clean and dry as this will help prolong the life of the electrodes. After use, always check skin for unusual red areas, rash or other skin difficulties. If there are any skin problems, does not apply electrodes to the same area. Never remove the electrodes from the unit by pulling the wires. Do not use the TENS unit or electrodes other than as directed. Do not change electrode placement without consultating your therapist or physician. Keep 2 fingers with between each electrode. Wear time ratio is 2:1, on to off times.    For example on for 30 minutes off for 15 minutes and then on for 30 minutes off for 15 minutes   

## 2019-02-10 ENCOUNTER — Other Ambulatory Visit: Payer: Self-pay

## 2019-02-10 ENCOUNTER — Encounter: Payer: Self-pay | Admitting: Physical Therapy

## 2019-02-10 ENCOUNTER — Ambulatory Visit (INDEPENDENT_AMBULATORY_CARE_PROVIDER_SITE_OTHER): Payer: BC Managed Care – PPO | Admitting: Physical Therapy

## 2019-02-10 DIAGNOSIS — R293 Abnormal posture: Secondary | ICD-10-CM | POA: Diagnosis not present

## 2019-02-10 DIAGNOSIS — M542 Cervicalgia: Secondary | ICD-10-CM

## 2019-02-10 DIAGNOSIS — M5442 Lumbago with sciatica, left side: Secondary | ICD-10-CM | POA: Diagnosis not present

## 2019-02-10 NOTE — Therapy (Signed)
[facility] [place] [address] [place], [place], [address] Phone: [phone]   Fax:  [phone]  Physical Therapy Treatment  Patient Details  Name: Anne Spencer MRN: [id number] Date of Birth: Dec 30, 1980 Referring Provider (PT): Monica Becton, MD   Encounter Date: 02/10/2019  PT End of Session - 02/10/19 1147    Visit Number  4    Number of Visits  12    Date for PT Re-Evaluation  03/04/19    PT Start Time  1015    PT Stop Time  1100    PT Time Calculation (min)  45 min    Activity Tolerance  Patient tolerated treatment well    Behavior During Therapy  [facility] for tasks assessed/performed       Past Medical History:  Diagnosis Date  . Anxiety   . Multiple thyroid nodules     Past Surgical History:  Procedure Laterality Date  . BIOPSY THYROID      There were no vitals filed for this visit.  Subjective Assessment - 02/10/19 1013    Subjective  doing well.  "I think I've done something wrong."  having tingling into fingertips on Lt hand    Patient Stated Goals  improve neck pain, and lower back pain    Currently in Pain?  No/denies                       [facility] Adult PT Treatment/Exercise - 02/10/19 1017      Neck Exercises: Machines for Strengthening   UBE (Upper Arm Bike)  L2 x 4 min (2' each direction)      Neck Exercises: Standing   Other Standing Exercises  scap retraction x 5 sec x 10; W's x 3 sec x 10       Neck Exercises: Seated   Neck Retraction  3 secs;10 reps    Shoulder Rolls  Backwards;5 reps    Other Seated Exercise  ulnar nerve glide RUE x 10 reps (gradual progress- bird man pose); added flossing with sidebending to nerve glide      Modalities   Modalities  --   declined-will do at home     Manual Therapy   Manual Therapy  Soft tissue mobilization    Soft tissue mobilization  IASTM to Rt upper trap and neck      Neck Exercises: Stretches   Other Neck Stretches   mid level doorway stretch x 20 sec x 2 reps - cues for form                   PT Long Term Goals - 01/21/19 0947      PT LONG TERM GOAL #1   Title  independent with HEP    Status  New    Target Date  03/04/19      PT LONG TERM GOAL #2   Title  improve Rt cervical rotation to at least 75 degrees for improved mobility    Status  New    Target Date  03/04/19      PT LONG TERM GOAL #3   Title  report pain < 4/10 with sleeping/lying flat for improved function    Status  New    Target Date  03/04/19      PT LONG TERM GOAL #4   Title  verbalize understanding of postural awareness to decrease risk of reinjury    Status  New    Target Date  03/04/19            Plan - 02/10/19 1147    Clinical Impression Statement  Pt reports continued decreased pain today following prior session, so continued with current plan.  Pt overally progressing well, and nearing end of POC next week, will plan to hold v/s recert then pending progress.    Examination-Activity Limitations  Bed Mobility;Sleep    Examination-Participation Restrictions  --   work   Stability/Clinical Decision Making  Stable/Uncomplicated    Rehab Potential  Good    PT Frequency  2x / week   1-2x/wk   PT Duration  6 weeks    PT Treatment/Interventions  ADLs/Self Care Home Management;Cryotherapy;Electrical Stimulation;Ultrasound;Traction;Moist Heat;Iontophoresis 4mg /ml Dexamethasone;Functional mobility training;Therapeutic activities;Therapeutic exercise;Patient/family education;Manual techniques;Passive range of motion;Taping;Dry needling    PT Next Visit Plan  recert v/s hold, continue with posture/manual/modalieis    PT Home Exercise Plan  Access Code: [place] and Agree with Plan of Care  Patient       Patient will benefit from skilled therapeutic intervention in order to improve the following deficits and impairments:  Decreased range of motion, Increased fascial restricitons, Increased muscle  spasms, Pain, Decreased strength, Postural dysfunction  Visit Diagnosis: Cervicalgia  Abnormal posture  Acute left-sided low back pain with left-sided sciatica     Problem List Patient Active Problem List   Diagnosis Date Noted  . Radiculitis of left cervical region 01/15/2019  . Discogenic low back pain 01/15/2019  . Paresthesia 03/11/2017      Laureen Abrahams, PT, DPT 02/10/19 11:49 AM     [facility] [place], [place], [address] Phone: [phone]   Fax:  [phone]  Name: Anne Spencer MRN: [id number] Date of Birth: [birth year]

## 2019-02-17 ENCOUNTER — Other Ambulatory Visit: Payer: Self-pay

## 2019-02-17 ENCOUNTER — Encounter: Payer: BC Managed Care – PPO | Admitting: Physical Therapy

## 2019-02-17 ENCOUNTER — Encounter: Payer: Self-pay | Admitting: Physical Therapy

## 2019-02-17 ENCOUNTER — Ambulatory Visit (INDEPENDENT_AMBULATORY_CARE_PROVIDER_SITE_OTHER): Payer: BC Managed Care – PPO | Admitting: Physical Therapy

## 2019-02-17 DIAGNOSIS — M542 Cervicalgia: Secondary | ICD-10-CM | POA: Diagnosis not present

## 2019-02-17 DIAGNOSIS — R293 Abnormal posture: Secondary | ICD-10-CM | POA: Diagnosis not present

## 2019-02-17 DIAGNOSIS — M5442 Lumbago with sciatica, left side: Secondary | ICD-10-CM

## 2019-02-17 NOTE — Therapy (Addendum)
[place], [place], [address] Phone: [phone]   Fax:  [phone]  Physical Therapy Treatment/Discharge Summary  Patient Details  Name: Anne Spencer MRN: [id number] Date of Birth: 28-Sep-1980 Referring Provider (PT): Silverio Decamp, MD   Encounter Date: 02/17/2019  PT End of Session - 02/17/19 1023    Visit Number  5    Number of Visits  12    Date for PT Re-Evaluation  03/04/19    PT Start Time  0930    PT Stop Time  1012    PT Time Calculation (min)  42 min    Activity Tolerance  Patient tolerated treatment well    Behavior During Therapy  [facility] for tasks assessed/performed       Past Medical History:  Diagnosis Date  . Anxiety   . Multiple thyroid nodules     Past Surgical History:  Procedure Laterality Date  . BIOPSY THYROID      There were no vitals filed for this visit.  Subjective Assessment - 02/17/19 0932    Subjective  doing well; no pain in neck; "still a little bit of popping"    Patient Stated Goals  improve neck pain, and lower back pain    Currently in Pain?  No/denies         [facility] PT Assessment - 02/17/19 0946      Assessment   Medical Diagnosis  Left cervical radiculitis, lumbar DDD    Referring Provider (PT)  Silverio Decamp, MD      AROM   Cervical - Right Rotation  not formally measured; at least 80 degrees without pain                   [facility] Adult PT Treatment/Exercise - 02/17/19 0935      Self-Care   Other Self-Care Comments   discussed home work station set up and ways to decrease repetitive movements of neck; pt verbalized understanding; reviewed HEP and pt performed 1-2 reps of each cervical HEP demonstrating proper technique.      Exercises   Exercises  Neck;Lumbar      Neck Exercises: Machines for Strengthening   UBE (Upper Arm Bike)  L3 x 4 min (2' each direction)      Lumbar Exercises: Stretches   Passive  Hamstring Stretch  Right;Left;2 reps;30 seconds    Piriformis Stretch  Right;Left;2 reps;30 seconds      Lumbar Exercises: Supine   Ab Set  5 reps;5 seconds    Isometric Hip Flexion  10 reps;5 seconds    Isometric Hip Flexion Limitations  bil    Other Supine Lumbar Exercises  raised hooklying: pelvic tilts x 10 reps; twists x 10 reps; pilates hundreds x 15-20 reps; isometric twist hold x 10 sec bil      Neck Exercises: Stretches   Other Neck Stretches  mid level doorway stretch x 20 sec x 2 reps - cues for form              PT Education - 02/17/19 1023    Education Details  lumbar/core HEP    Person(s) Educated  Patient    Methods  Explanation;Demonstration;Handout    Comprehension  Verbalized understanding;Returned demonstration          PT Long Term Goals - 02/17/19 1023      PT LONG TERM GOAL #1   Title  independent with HEP    Status  Achieved  PT LONG TERM GOAL #2   Title  improve Rt cervical rotation to at least 75 degrees for improved mobility    Status  Achieved      PT LONG TERM GOAL #3   Title  report pain < 4/10 with sleeping/lying flat for improved function    Status  Achieved      PT LONG TERM GOAL #4   Title  verbalize understanding of postural awareness to decrease risk of reinjury    Status  Achieved            Plan - 02/17/19 1024    Clinical Impression Statement  Pt has met all goals at this time.  Issued HEP for intermittent LBP to work on core strengthening and flexibility.  Pt requesting to hold PT x 30 days, will call if symptoms return and plan to reasses.  If she doesn't return, will d/c from PT.    Examination-Activity Limitations  Bed Mobility;Sleep    Examination-Participation Restrictions  --   work   Stability/Clinical Decision Making  Stable/Uncomplicated    Rehab Potential  Good    PT Frequency  2x / week   1-2x/wk   PT Duration  6 weeks    PT Treatment/Interventions  ADLs/Self Care Home  Management;Cryotherapy;Electrical Stimulation;Ultrasound;Traction;Moist Heat;Iontophoresis 21m/ml Dexamethasone;Functional mobility training;Therapeutic activities;Therapeutic exercise;Patient/family education;Manual techniques;Passive range of motion;Taping;Dry needling    PT Next Visit Plan  hold x 30 days; reassess if pt returns, otherwise d/c    PT Home Exercise Plan  Access Code: [email or web address]    Consulted and Agree with Plan of Care  Patient       Patient will benefit from skilled therapeutic intervention in order to improve the following deficits and impairments:  Decreased range of motion, Increased fascial restricitons, Increased muscle spasms, Pain, Decreased strength, Postural dysfunction  Visit Diagnosis: Abnormal posture  Cervicalgia  Acute left-sided low back pain with left-sided sciatica     Problem List Patient Active Problem List   Diagnosis Date Noted  . Radiculitis of left cervical region 01/15/2019  . Discogenic low back pain 01/15/2019  . Paresthesia 03/11/2017      SLaureen Abrahams PT, DPT 02/17/19 10:26 AM     [facility] [place] [place] [address]Phone: [phone]  Fax:  [phone] Name: Anne WIETINGMRN: [id number] of Birth: 9Aug 16, 1982     PHYSICAL THERAPY DISCHARGE SUMMARY  Visits from Start of Care: 5  Current functional level related to goals / functional outcomes: See above   Remaining deficits: See above   Education / Equipment: HEP  Plan: Patient agrees to discharge.  Patient goals were met. Patient is being discharged due to meeting the stated rehab goals.  ?????    SLaureen Abrahams PT, DPT 04/27/19 8:32 AM  [facility] [place] [place] [place] [id number] [phone](office) [phone](fax)

## 2019-02-17 NOTE — Patient Instructions (Signed)
Access Code: [email or web address]  URL: [email or web address]  Date: 02/17/2019  Prepared by: Faustino Congress   Exercises  Hooklying Single Knee to Chest - 3 reps - 1 sets - 30 sec hold - 2x daily - 7x weekly  Supine Double Knee to Chest - 3 reps - 1 sets - 30 sec hold - 2x daily - 7x weekly  Seated Hamstring Stretch - 3 reps - 1 sets - 30 sec hold - 2x daily - 7x weekly  Seated Piriformis Stretch - 3 reps - 1 sets - 30 sec hold - 2x daily - 7x weekly  Supine Posterior Pelvic Tilt - 10 reps - 1-2 sets - 5 sec hold - 2x daily - 7x weekly  Oblique Abdominal Isometrics with Reach on Balance Disk - 10 reps - 1-2 sets - 5-10 sec hold - 1x daily - 7x weekly  Abdominal Isometrics on Balance Disk - 10 reps - 1-2 sets - 5-10 sec hold - 1x daily - 7x weekly  Supine Bilateral Isometric Hip Flexion - 10 reps - 1-2 sets - 5-10 sec hold - 1x daily - 7x weekly

## 2019-02-26 ENCOUNTER — Ambulatory Visit: Payer: BC Managed Care – PPO | Admitting: Sports Medicine

## 2019-03-02 ENCOUNTER — Encounter: Payer: Self-pay | Admitting: Sports Medicine

## 2019-03-02 ENCOUNTER — Other Ambulatory Visit: Payer: Self-pay

## 2019-03-02 ENCOUNTER — Ambulatory Visit (INDEPENDENT_AMBULATORY_CARE_PROVIDER_SITE_OTHER): Payer: BC Managed Care – PPO | Admitting: Sports Medicine

## 2019-03-02 DIAGNOSIS — M5136 Other intervertebral disc degeneration, lumbar region with discogenic back pain only: Secondary | ICD-10-CM

## 2019-03-02 DIAGNOSIS — M5412 Radiculopathy, cervical region: Secondary | ICD-10-CM

## 2019-03-02 NOTE — Assessment & Plan Note (Signed)
Resolved with conservative treatment. 

## 2019-03-02 NOTE — Progress Notes (Signed)
Subjective:    CC: Follow-up  HPI: Anne Spencer returns, she is a pleasant 38 year old female with cervical and lumbar DDD, she responded well to conservative treatment, therapy, medications and is pain-free now.  Happy with how things are going and ready to party.  I reviewed the past medical history, family history, social history, surgical history, and allergies today and no changes were needed.  Please see the problem list section below in epic for further details.  Past Medical History: Past Medical History:  Diagnosis Date  . Anxiety   . Multiple thyroid nodules    Past Surgical History: Past Surgical History:  Procedure Laterality Date  . BIOPSY THYROID     Social History: Social History   Socioeconomic History  . Marital status: Married    Spouse name: Not on file  . Number of children: 1  . Years of education: 38  . Highest education level: Bachelor's degree (e.g., BA, AB, BS)  Occupational History  . Occupation: Research officer, political party  Social Needs  . Financial resource strain: Not on file  . Food insecurity    Worry: Not on file    Inability: Not on file  . Transportation needs    Medical: Not on file    Non-medical: Not on file  Tobacco Use  . Smoking status: Never Smoker  . Smokeless tobacco: Never Used  Substance and Sexual Activity  . Alcohol use: No  . Drug use: No  . Sexual activity: Not on file  Lifestyle  . Physical activity    Days per week: Not on file    Minutes per session: Not on file  . Stress: Not on file  Relationships  . Social Herbalist on phone: Not on file    Gets together: Not on file    Attends religious service: Not on file    Active member of club or organization: Not on file    Attends meetings of clubs or organizations: Not on file    Relationship status: Not on file  Other Topics Concern  . Not on file  Social History Narrative   Lives at home with husband and child.   Left-handed.   1 cup caffeine per  day.   Family History: Family History  Problem Relation Age of Onset  . Healthy Mother   . Heart disease Father   . Diabetes Father   . Stroke Father    Allergies: Allergies  Allergen Reactions  . Fluconazole Itching, Rash and Swelling    Other reaction(s): Redness   . Lidocaine Palpitations  . Buspirone Hives  . Erythromycin Rash   Medications: See med rec.  Review of Systems: No fevers, chills, night sweats, weight loss, chest pain, or shortness of breath.   Objective:    General: Well Developed, well nourished, and in no acute distress.  Neuro: Alert and oriented x3, extra-ocular muscles intact, sensation grossly intact.  HEENT: Normocephalic, atraumatic, pupils equal round reactive to light, neck supple, no masses, no lymphadenopathy, thyroid nonpalpable.  Skin: Warm and dry, no rashes. Cardiac: Regular rate and rhythm, no murmurs rubs or gallops, no lower extremity edema.  Respiratory: Clear to auscultation bilaterally. Not using accessory muscles, speaking in full sentences.  Impression and Recommendations:    Discogenic low back pain Resolved with conservative treatment.  Radiculitis of left cervical region Resolved with conservative treatment.   ___________________________________________ Gwen Her. Dianah Field, M.D., ABFM., CAQSM. Primary Care and Sports Medicine [facility]  Adjunct Professor of [facility]  Medicine  [employer]

## 2019-06-03 ENCOUNTER — Other Ambulatory Visit: Payer: Self-pay

## 2019-06-03 ENCOUNTER — Encounter: Payer: Self-pay | Admitting: Allergy and Immunology

## 2019-06-03 ENCOUNTER — Telehealth: Payer: Self-pay | Admitting: Allergy and Immunology

## 2019-06-03 ENCOUNTER — Ambulatory Visit (INDEPENDENT_AMBULATORY_CARE_PROVIDER_SITE_OTHER): Payer: BC Managed Care – PPO | Admitting: Allergy and Immunology

## 2019-06-03 VITALS — BP 120/82 | HR 112 | Resp 16 | Ht 64.2 in | Wt 137.4 lb

## 2019-06-03 DIAGNOSIS — J310 Chronic rhinitis: Secondary | ICD-10-CM | POA: Diagnosis not present

## 2019-06-03 DIAGNOSIS — L500 Allergic urticaria: Secondary | ICD-10-CM

## 2019-06-03 HISTORY — DX: Allergic urticaria: L50.0

## 2019-06-03 MED ORDER — LEVOCETIRIZINE DIHYDROCHLORIDE 5 MG PO TABS: 5.0000 mg | ORAL_TABLET | Freq: Every day | ORAL | 5 refills | Status: AC | PRN

## 2019-06-03 MED ORDER — AZELASTINE HCL 0.15 % NA SOLN: NASAL | 5 refills | Status: AC

## 2019-06-03 MED ORDER — FAMOTIDINE 20 MG PO TABS: 20.0000 mg | ORAL_TABLET | Freq: Two times a day (BID) | ORAL | 5 refills | Status: AC | PRN

## 2019-06-03 NOTE — Assessment & Plan Note (Signed)
All seasonal and perennial aeroallergen skin tests are negative despite a positive histamine control.  Intranasal steroids, intranasal antihistamines, and first generation antihistamines are effective for symptoms associated with non-allergic rhinitis, whereas second generation antihistamines such as cetirizine (Zyrtec), loratadine (Claritin) and fexofenadine (Allegra) have been found to be ineffective for this condition.  A prescription has been provided for azelastine nasal spray, one spray per nostril 1-2 times daily as needed. Proper nasal spray technique has been discussed and demonstrated.  Nasal saline spray (i.e. Simply Saline) is recommended prior to medicated nasal sprays and as needed.

## 2019-06-03 NOTE — Progress Notes (Signed)
New Patient Note  RE: DELAINA FETSCH MRN: [id number] DOB: 1981-03-21 Date of Office Visit: 06/03/2019  Referring provider: Michiel Cowboy, PA-C Primary care provider: Jonnie Kind, MD  Chief Complaint: Pruritus, Urticaria, and Food Intolerance  History of present illness: Anne Spencer is a 39 y.o. female seen today in consultation requested by Michiel Cowboy, PA-C.  She reports that approximately 7 weeks ago she was diagnosed with COVID-19.  She experienced "flu symptoms x50."  In addition to the other symptoms associated with Covid-19 she began to experience severe pruritus over her torso.  The pruritus spread from the torso to the rest of her body.  Her skin was red in certain areas but she does not believe that she developed actual urticarial lesions.  She did not experience angioedema, cardiopulmonary symptoms, or GI symptoms.  She has not had recurrent fevers, drenching night sweats, or unexpected weight loss over the past year.  She has tried D-Hist and multiple supplements, including vitamin C and vitamin D without relief.  She added cetirizine to the other supplements with mild improvement of symptoms, however the cetirizine caused mild somnolence.  Over the past few weeks the pruritus has persisted, however has decreased by approximately 50%.  Because she has a history of high ANA levels, she is planning to see a rheumatologist in the near future.  Other than COVID-19 infection, no specific medication, food, skin care product, detergent, soap, or other environmental triggers have been identified. Isabella reports that over the past week she consumed a white pizza and shortly thereafter her cheeks became "fiery red" and her face, head, and ears became more pruritic.  She took diphenhydramine with benefit.  She is currently being monitored/evaluated for sinus tachycardia and was recently started on a beta-blocker.  The tachycardia seems to be worse on the rare occasions when she consumes  alcohol, however typically occurs "at random." Summary complains of clear rhinorrhea which occurs when consuming meals.  No specific culprit foods have been identified.  She does not experience significant nasal or ocular symptoms with environmental aeroallergen exposures.  Assessment and plan: Recurrent urticaria/pruritus Often times the onset of urticaria is secondary to viral infection, even if clinical manifestations of an infection are not clearly evident. Once the mast cell membranes have been destabilized, it is not unusual for recurrent episodes of histamine release to occur in the ensuing weeks or months.  Skin tests to select food and environmental allergens were negative today. NSAIDs and emotional stress commonly exacerbate urticaria but are not the underlying etiology in this case. Physical urticarias are negative by history (i.e. pressure-induced, temperature, vibration, solar, etc.). History and lesions are not consistent with urticaria pigmentosa so I am not suspicious for mastocytosis. There are no concomitant symptoms concerning for anaphylaxis or constitutional symptoms worrisome for an underlying malignancy. We will not order labs at this time, however, if lesions recur, persist, progress, or change in character, we will assess other potential etiologies with screening labs.  For symptom relief, the patient is to take oral antihistamines as directed.  Instructions have been provided and discussed for H1/H2 receptor blockade with step-wise increase/decrease to find lowest effective dose.  A prescription has been provided for levocetirizine(Xyzal), 5 mg daily as needed.  A prescription has been provided for famotidine (Pepcid), 20 mg twice daily as needed.  Should significant symptoms recur or new symptoms occur, a journal is to be kept recording any foods eaten, beverages consumed, medications taken within a 6 hour period prior to  the onset of symptoms, as well as record activities  being performed, and environmental conditions. For any symptoms concerning for anaphylaxis, 911 is to be called immediately.  Due to the severity of symptoms, prednisone has been provided, 20 mg x 4 days, 10 mg x1 day, then stop.  Monitor heart rate while on prednisone and discontinue if there is a problem.  Gustatory rhinitis All seasonal and perennial aeroallergen skin tests are negative despite a positive histamine control.  Intranasal steroids, intranasal antihistamines, and first generation antihistamines are effective for symptoms associated with non-allergic rhinitis, whereas second generation antihistamines such as cetirizine (Zyrtec), loratadine (Claritin) and fexofenadine (Allegra) have been found to be ineffective for this condition.  A prescription has been provided for azelastine nasal spray, one spray per nostril 1-2 times daily as needed. Proper nasal spray technique has been discussed and demonstrated.  Nasal saline spray (i.e. Simply Saline) is recommended prior to medicated nasal sprays and as needed.   Meds ordered this encounter  Medications  . levocetirizine (XYZAL) 5 MG tablet    Sig: Take 1 tablet (5 mg total) by mouth daily as needed for allergies.    Dispense:  30 tablet    Refill:  5  . famotidine (PEPCID) 20 MG tablet    Sig: Take 1 tablet (20 mg total) by mouth 2 (two) times daily as needed for heartburn or indigestion.    Dispense:  60 tablet    Refill:  5  . Azelastine HCl 0.15 % SOLN    Sig: 1 spray per nostril 1-2 times daily prn    Dispense:  30 mL    Refill:  5    Diagnostics: Environmental skin testing: Negative despite a positive histamine control. Food allergen skin testing: Negative despite a positive histamine control.    Physical examination: Blood pressure 120/82, pulse (!) 112, resp. rate 16, height 5' 4.2" (1.631 m), weight 137 lb 6.4 oz (62.3 kg), SpO2 100 %.  General: Alert, interactive, in no acute distress. HEENT: TMs pearly gray,  turbinates mildly edematous without discharge, post-pharynx unremarkable. Neck: Supple without lymphadenopathy. Lungs: Clear to auscultation without wheezing, rhonchi or rales. CV: Normal S1, S2 without murmurs. Abdomen: Nondistended, nontender. Skin: Warm and dry, without lesions or rashes. Extremities:  No clubbing, cyanosis or edema. Neuro:   Grossly intact.  Review of systems:  Review of systems negative except as noted in HPI / PMHx or noted below: Review of Systems  Constitutional: Negative.   HENT: Negative.   Eyes: Negative.   Respiratory: Negative.   Cardiovascular: Negative.   Gastrointestinal: Negative.   Genitourinary: Negative.   Musculoskeletal: Negative.   Skin: Negative.   Neurological: Negative.   Endo/Heme/Allergies: Negative.   Psychiatric/Behavioral: Negative.     Past medical history:  Past Medical History:  Diagnosis Date  . Anxiety   . Multiple thyroid nodules   . Recurrent urticaria/pruritus 06/03/2019    Past surgical history:  Past Surgical History:  Procedure Laterality Date  . BIOPSY THYROID      Family history: Family History  Problem Relation Age of Onset  . Healthy Mother   . Heart disease Father   . Diabetes Father   . Stroke Father   . Asthma Brother   . Allergic rhinitis Neg Hx   . Eczema Neg Hx   . Urticaria Neg Hx     Social history: Social History   Socioeconomic History  . Marital status: Married    Spouse name: Not on file  . Number  of children: 1  . Years of education: 29  . Highest education level: Bachelor's degree (e.g., BA, AB, BS)  Occupational History  . Occupation: Psychologist, clinical  Tobacco Use  . Smoking status: Never Smoker  . Smokeless tobacco: Never Used  Substance and Sexual Activity  . Alcohol use: No  . Drug use: No  . Sexual activity: Not on file  Other Topics Concern  . Not on file  Social History Narrative   Lives at home with husband and child.   Left-handed.   1 cup caffeine  per day.   Social Determinants of Health   Financial Resource Strain:   . Difficulty of Paying Living Expenses: Not on file  Food Insecurity:   . Worried About Programme researcher, broadcasting/film/video in the Last Year: Not on file  . Ran Out of Food in the Last Year: Not on file  Transportation Needs:   . Lack of Transportation (Medical): Not on file  . Lack of Transportation (Non-Medical): Not on file  Physical Activity:   . Days of Exercise per Week: Not on file  . Minutes of Exercise per Session: Not on file  Stress:   . Feeling of Stress : Not on file  Social Connections:   . Frequency of Communication with Friends and Family: Not on file  . Frequency of Social Gatherings with Friends and Family: Not on file  . Attends Religious Services: Not on file  . Active Member of Clubs or Organizations: Not on file  . Attends Banker Meetings: Not on file  . Marital Status: Not on file  Intimate Partner Violence:   . Fear of Current or Ex-Partner: Not on file  . Emotionally Abused: Not on file  . Physically Abused: Not on file  . Sexually Abused: Not on file    Environmental History: The patient lives in a 40 year old house with carpeting throughout and heat pump HVAC.  There is no known mold/water damage in the home.  There are no pets in the home.  She is a non-smoker.   Current Outpatient Medications  Medication Sig Dispense Refill  . ALPRAZolam (XANAX) 0.5 MG tablet alprazolam 0.5 mg tablet    . Ascorbic Acid (VITAMIN C) 100 MG tablet Take 100 mg by mouth daily.    . calcium-vitamin D (OSCAL WITH D) 500-200 MG-UNIT tablet Take 1 tablet by mouth.    . Multiple Vitamin (MULTIVITAMIN) capsule Take 1 capsule daily by mouth.    . nadolol (CORGARD) 20 MG tablet Take by mouth.    . norethindrone (MICRONOR) 0.35 MG tablet Take by mouth.    . Thyroid (NATURE-THROID PO) Take by mouth.    . Azelastine HCl 0.15 % SOLN 1 spray per nostril 1-2 times daily prn 30 mL 5  . famotidine (PEPCID) 20 MG  tablet Take 1 tablet (20 mg total) by mouth 2 (two) times daily as needed for heartburn or indigestion. 60 tablet 5  . levocetirizine (XYZAL) 5 MG tablet Take 1 tablet (5 mg total) by mouth daily as needed for allergies. 30 tablet 5   No current facility-administered medications for this visit.    Known medication allergies: Allergies  Allergen Reactions  . Fluconazole Itching, Rash and Swelling    Other reaction(s): Redness   . Lidocaine Palpitations  . Buspirone Hives  . Erythromycin Rash    I appreciate the opportunity to take part in Jakiya's care. Please do not hesitate to contact me with questions.  Sincerely,  R. Carter Alano Blasco, MD 

## 2019-06-03 NOTE — Assessment & Plan Note (Addendum)
Often times the onset of urticaria is secondary to viral infection, even if clinical manifestations of an infection are not clearly evident. Once the mast cell membranes have been destabilized, it is not unusual for recurrent episodes of histamine release to occur in the ensuing weeks or months.  Skin tests to select food and environmental allergens were negative today. NSAIDs and emotional stress commonly exacerbate urticaria but are not the underlying etiology in this case. Physical urticarias are negative by history (i.e. pressure-induced, temperature, vibration, solar, etc.). History and lesions are not consistent with urticaria pigmentosa so I am not suspicious for mastocytosis. There are no concomitant symptoms concerning for anaphylaxis or constitutional symptoms worrisome for an underlying malignancy. We will not order labs at this time, however, if lesions recur, persist, progress, or change in character, we will assess other potential etiologies with screening labs.  For symptom relief, the patient is to take oral antihistamines as directed.  Instructions have been provided and discussed for H1/H2 receptor blockade with step-wise increase/decrease to find lowest effective dose.  A prescription has been provided for levocetirizine(Xyzal), 5 mg daily as needed.  A prescription has been provided for famotidine (Pepcid), 20 mg twice daily as needed.  Should significant symptoms recur or new symptoms occur, a journal is to be kept recording any foods eaten, beverages consumed, medications taken within a 6 hour period prior to the onset of symptoms, as well as record activities being performed, and environmental conditions. For any symptoms concerning for anaphylaxis, 911 is to be called immediately.  Due to the severity of symptoms, prednisone has been provided, 20 mg x 4 days, 10 mg x1 day, then stop.  Monitor heart rate while on prednisone and discontinue if there is a problem.

## 2019-06-03 NOTE — Telephone Encounter (Signed)
Please advise 

## 2019-06-03 NOTE — Telephone Encounter (Signed)
PT called and scheduled Rheumatology appt with Dr Zenovia Jordan. Please fax the chart notes to this Dr at fax [phone], Beckey Rutter.

## 2019-06-03 NOTE — Patient Instructions (Addendum)
Recurrent urticaria/pruritus Often times the onset of urticaria is secondary to viral infection, even if clinical manifestations of an infection are not clearly evident. Once the mast cell membranes have been destabilized, it is not unusual for recurrent episodes of histamine release to occur in the ensuing weeks or months.  Skin tests to select food and environmental allergens were negative today. NSAIDs and emotional stress commonly exacerbate urticaria but are not the underlying etiology in this case. Physical urticarias are negative by history (i.e. pressure-induced, temperature, vibration, solar, etc.). History and lesions are not consistent with urticaria pigmentosa so I am not suspicious for mastocytosis. There are no concomitant symptoms concerning for anaphylaxis or constitutional symptoms worrisome for an underlying malignancy. We will not order labs at this time, however, if lesions recur, persist, progress, or change in character, we will assess other potential etiologies with screening labs.  For symptom relief, the patient is to take oral antihistamines as directed.  Instructions have been provided and discussed for H1/H2 receptor blockade with step-wise increase/decrease to find lowest effective dose.  A prescription has been provided for levocetirizine(Xyzal), 5 mg daily as needed.  A prescription has been provided for famotidine (Pepcid), 20 mg twice daily as needed.  Should significant symptoms recur or new symptoms occur, a journal is to be kept recording any foods eaten, beverages consumed, medications taken within a 6 hour period prior to the onset of symptoms, as well as record activities being performed, and environmental conditions. For any symptoms concerning for anaphylaxis, 911 is to be called immediately.  Due to the severity of symptoms, prednisone has been provided, 20 mg x 4 days, 10 mg x1 day, then stop.  Monitor heart rate while on prednisone and discontinue if there is a  problem.  Gustatory rhinitis All seasonal and perennial aeroallergen skin tests are negative despite a positive histamine control.  Intranasal steroids, intranasal antihistamines, and first generation antihistamines are effective for symptoms associated with non-allergic rhinitis, whereas second generation antihistamines such as cetirizine (Zyrtec), loratadine (Claritin) and fexofenadine (Allegra) have been found to be ineffective for this condition.  A prescription has been provided for azelastine nasal spray, one spray per nostril 1-2 times daily as needed. Proper nasal spray technique has been discussed and demonstrated.  Nasal saline spray (i.e. Simply Saline) is recommended prior to medicated nasal sprays and as needed.   Return in about 2 months (around 08/01/2019), or if symptoms worsen or fail to improve.  Urticaria (Hives)  . Levocetirizine (Xyzal) 5 mg twice a day and famotidine (Pepcid) 20 mg twice a day. If no symptoms for 7-14 days then decrease to. . Levocetirizine (Xyzal) 5 mg twice a day and famotidine (Pepcid) 20 mg once a day.  If no symptoms for 7-14 days then decrease to. . Levocetirizine (Xyzal) 5 mg twice a day.  If no symptoms for 7-14 days then decrease to. . Levocetirizine (Xyzal) 5 mg once a day.  May use Benadryl (diphenhydramine) as needed for breakthrough symptoms       If symptoms return, then step up dosage  MAY SUBSTITUTE ALLEGRA FOR XYZAL (LEVOCETIRIZINE) IF NEEDED.

## 2019-06-04 NOTE — Telephone Encounter (Signed)
Notes faxed.

## 2019-06-10 ENCOUNTER — Telehealth: Payer: Self-pay | Admitting: Allergy and Immunology

## 2019-06-10 DIAGNOSIS — L500 Allergic urticaria: Secondary | ICD-10-CM

## 2019-06-10 NOTE — Telephone Encounter (Signed)
PT called to let us know she is still itching. Eyes, Ears, Throat itch. Anything she eats will cause her mouth to itch. PT using all meds with no relief. PT would like to get lab work per Dr Nunzio Cobbs suggestion and wants to start the prednisone baby pack after labs are drawn. Please call pt when labs are ordered so she can go to [facility] Would like to go within the next 2 days. Anne Spencer [phone]

## 2019-06-10 NOTE — Telephone Encounter (Signed)
What labs do would you like ordered?

## 2019-06-10 NOTE — Telephone Encounter (Signed)
Please order the following labs: FCeRI antibody, anti-thyroglobulin antibody, thyroid peroxidase antibody, tryptase, H. pylori serology, CBC, CMP, ESR, ANA, and alpha-gal panel. The patient will be called with further recommendations after lab results have returned. Thanks.

## 2019-06-10 NOTE — Telephone Encounter (Signed)
Pt informed of lab orders waiting at front desk for her to pick up she will pick them up tomorrow

## 2019-06-17 LAB — COMPREHENSIVE METABOLIC PANEL
ALT: 13 IU/L (ref 0–32)
AST: 18 IU/L (ref 0–40)
Albumin/Globulin Ratio: 2 (ref 1.2–2.2)
Albumin: 4.8 g/dL (ref 3.8–4.8)
Alkaline Phosphatase: 68 IU/L (ref 39–117)
BUN/Creatinine Ratio: 8 — ABNORMAL LOW (ref 9–23)
BUN: 6 mg/dL (ref 6–20)
Bilirubin Total: 0.4 mg/dL (ref 0.0–1.2)
CO2: 21 mmol/L (ref 20–29)
Calcium: 10 mg/dL (ref 8.7–10.2)
Chloride: 102 mmol/L (ref 96–106)
Creatinine, Ser: 0.72 mg/dL (ref 0.57–1.00)
GFR calc Af Amer: 122 mL/min/{1.73_m2} (ref >59)
GFR calc non Af Amer: 106 mL/min/{1.73_m2} (ref >59)
Globulin, Total: 2.4 g/dL (ref 1.5–4.5)
Glucose: 79 mg/dL (ref 65–99)
Potassium: 4.6 mmol/L (ref 3.5–5.2)
Sodium: 141 mmol/L (ref 134–144)
Total Protein: 7.2 g/dL (ref 6.0–8.5)

## 2019-06-17 LAB — CBC
Hematocrit: 42.7 % (ref 34.0–46.6)
Hemoglobin: 14.3 g/dL (ref 11.1–15.9)
MCH: 30.8 pg (ref 26.6–33.0)
MCHC: 33.5 g/dL (ref 31.5–35.7)
MCV: 92 fL (ref 79–97)
Platelets: 237 10*3/uL (ref 150–450)
RBC: 4.64 x10E6/uL (ref 3.77–5.28)
RDW: 11.8 % (ref 11.7–15.4)
WBC: 5 10*3/uL (ref 3.4–10.8)

## 2019-06-17 LAB — CHRONIC URTICARIA: cu index: 3.3 (ref <10)

## 2019-06-17 LAB — ALPHA-GAL PANEL
Alpha Gal IgE*: <0.1 kU/L (ref <0.10)
Beef (Bos spp) IgE: <0.1 kU/L (ref <0.35)
Class Interpretation: 0
Class Interpretation: 0
Class Interpretation: 0
Lamb/Mutton (Ovis spp) IgE: <0.1 kU/L (ref <0.35)
Pork (Sus spp) IgE: <0.1 kU/L (ref <0.35)

## 2019-06-17 LAB — TRYPTASE: Tryptase: 6.5 ug/L (ref 2.2–13.2)

## 2019-06-17 LAB — THYROID PEROXIDASE ANTIBODY: Thyroperoxidase Ab SerPl-aCnc: <9 IU/mL (ref 0–34)

## 2019-06-17 LAB — H PYLORI, IGM, IGG, IGA AB
H pylori, IgM Abs: <9 units (ref 0.0–8.9)
H. pylori, IgA Abs: <9 units (ref 0.0–8.9)
H. pylori, IgG AbS: 0.19 Index Value (ref 0.00–0.79)

## 2019-06-17 LAB — SEDIMENTATION RATE: Sed Rate: 2 mm/hr (ref 0–32)

## 2019-06-17 LAB — THYROGLOBULIN ANTIBODY: Thyroglobulin Antibody: <1 IU/mL (ref 0.0–0.9)

## 2019-06-17 LAB — ANA: Anti Nuclear Antibody (ANA): NEGATIVE

## 2019-06-24 ENCOUNTER — Telehealth: Payer: Self-pay | Admitting: Allergy and Immunology

## 2019-06-24 NOTE — Telephone Encounter (Signed)
PT reports that arm where bloodwork was taken is really sore and cramping. No bruise.  PT arm is also swollen. PCP told he to call the ordering phys to check to see if there is a blood clot. PT blood was drawn on 2/04.

## 2019-06-26 NOTE — Telephone Encounter (Signed)
Spoke with Anne Spencer she said pt will need to follow up with her pcp on this matter as our drs do not handle blood clot issues  Spoke with pt and informed her of what Anne Spencer stated and she stated her understanding. She stated she would go to an urgent care if It gets worse as pcp was hesitant to see her due to covid previously. She stated she does have a follow up with cardiologist next Tuesday and will also mention it to them at that appointment and get their opinion on it.

## 2019-08-05 ENCOUNTER — Ambulatory Visit: Payer: BC Managed Care – PPO | Admitting: Allergy and Immunology

## 2019-12-24 IMAGING — DX DG LUMBAR SPINE COMPLETE 4+V
5 series · 5 of 5 positions shown · non-contrast
Comparison: None.

CLINICAL DATA: Chronic low back pain

EXAM:
LUMBAR SPINE - COMPLETE 4+ VIEW

[l-spine ap]
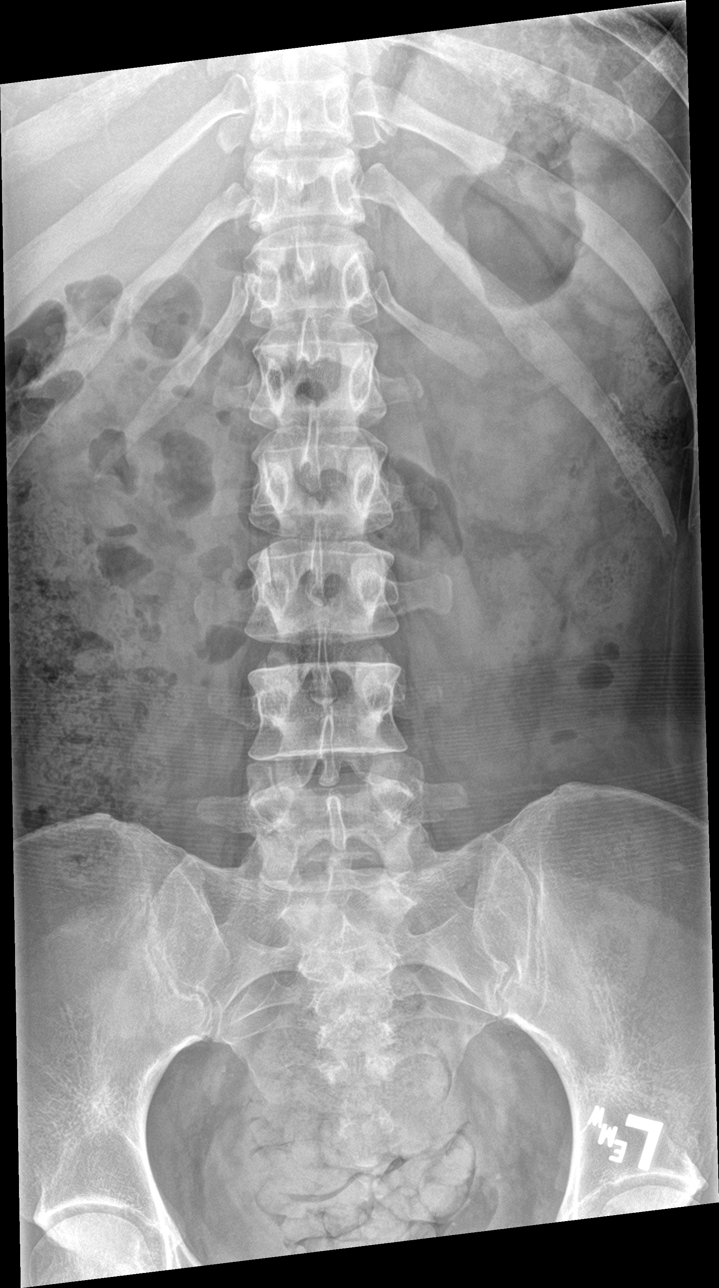

[l-spine obl (1 of 2)]
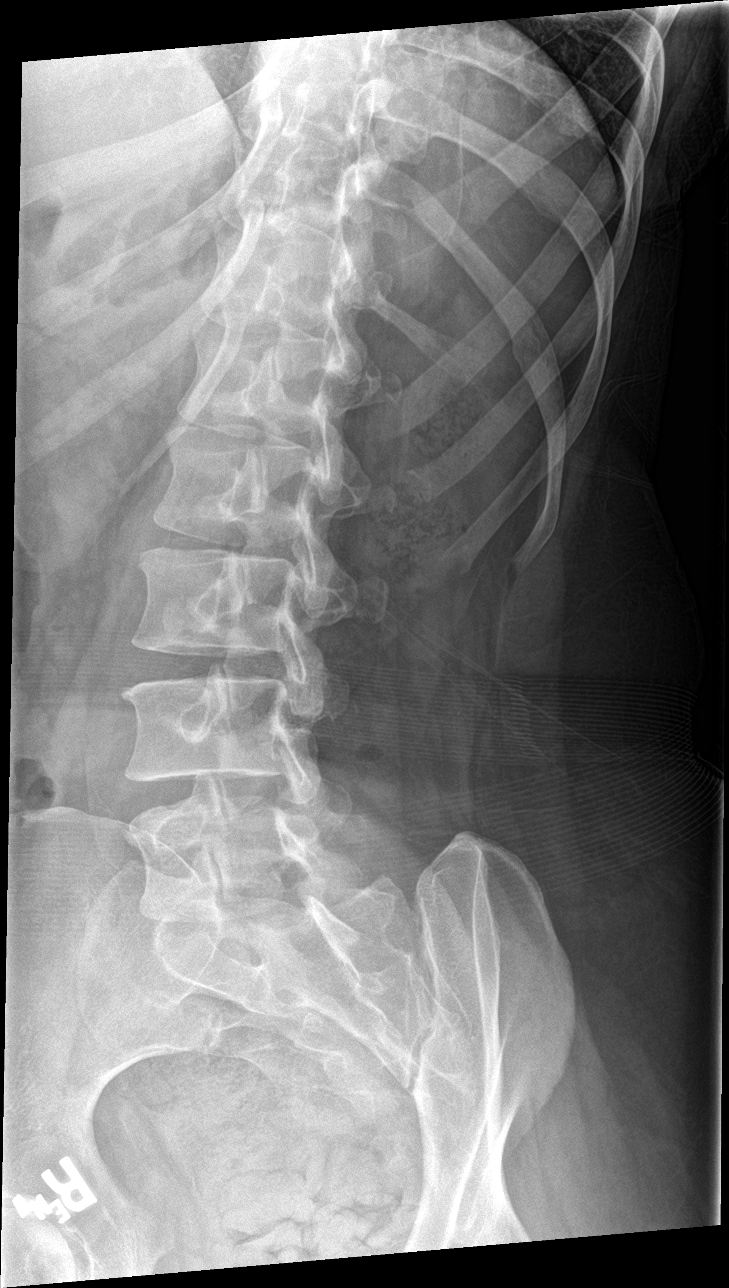

[l-spine obl (2 of 2)]
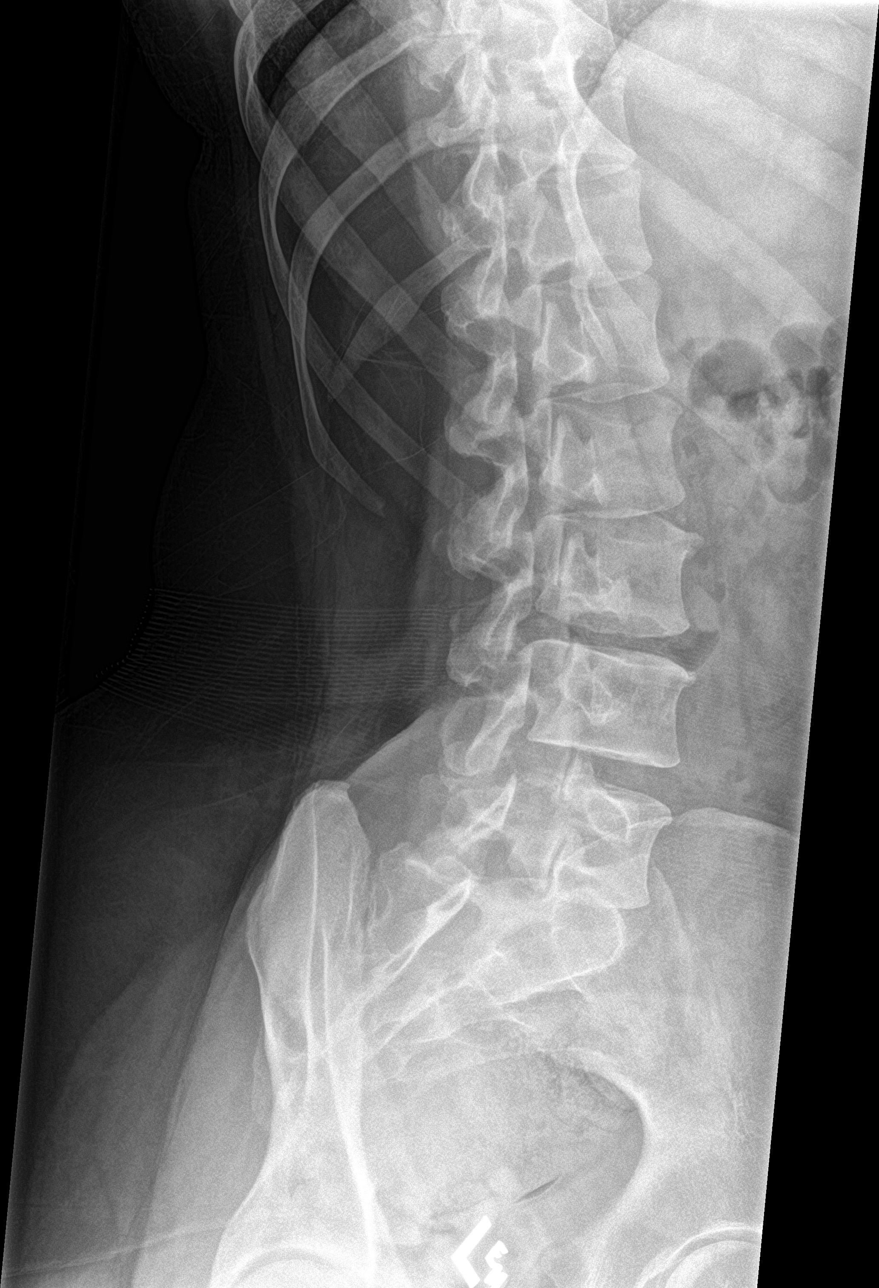

[l-spine lat]
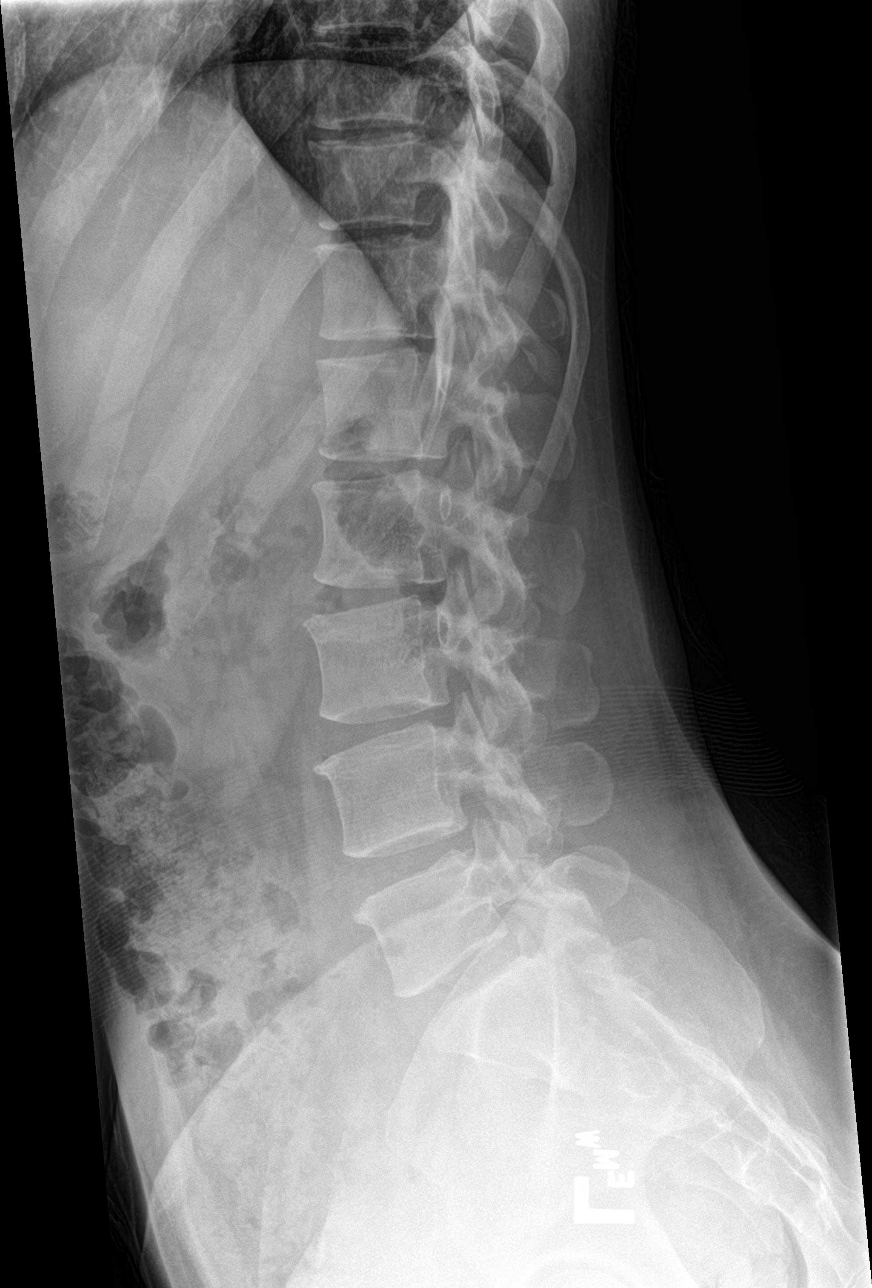

[l-spine spot]
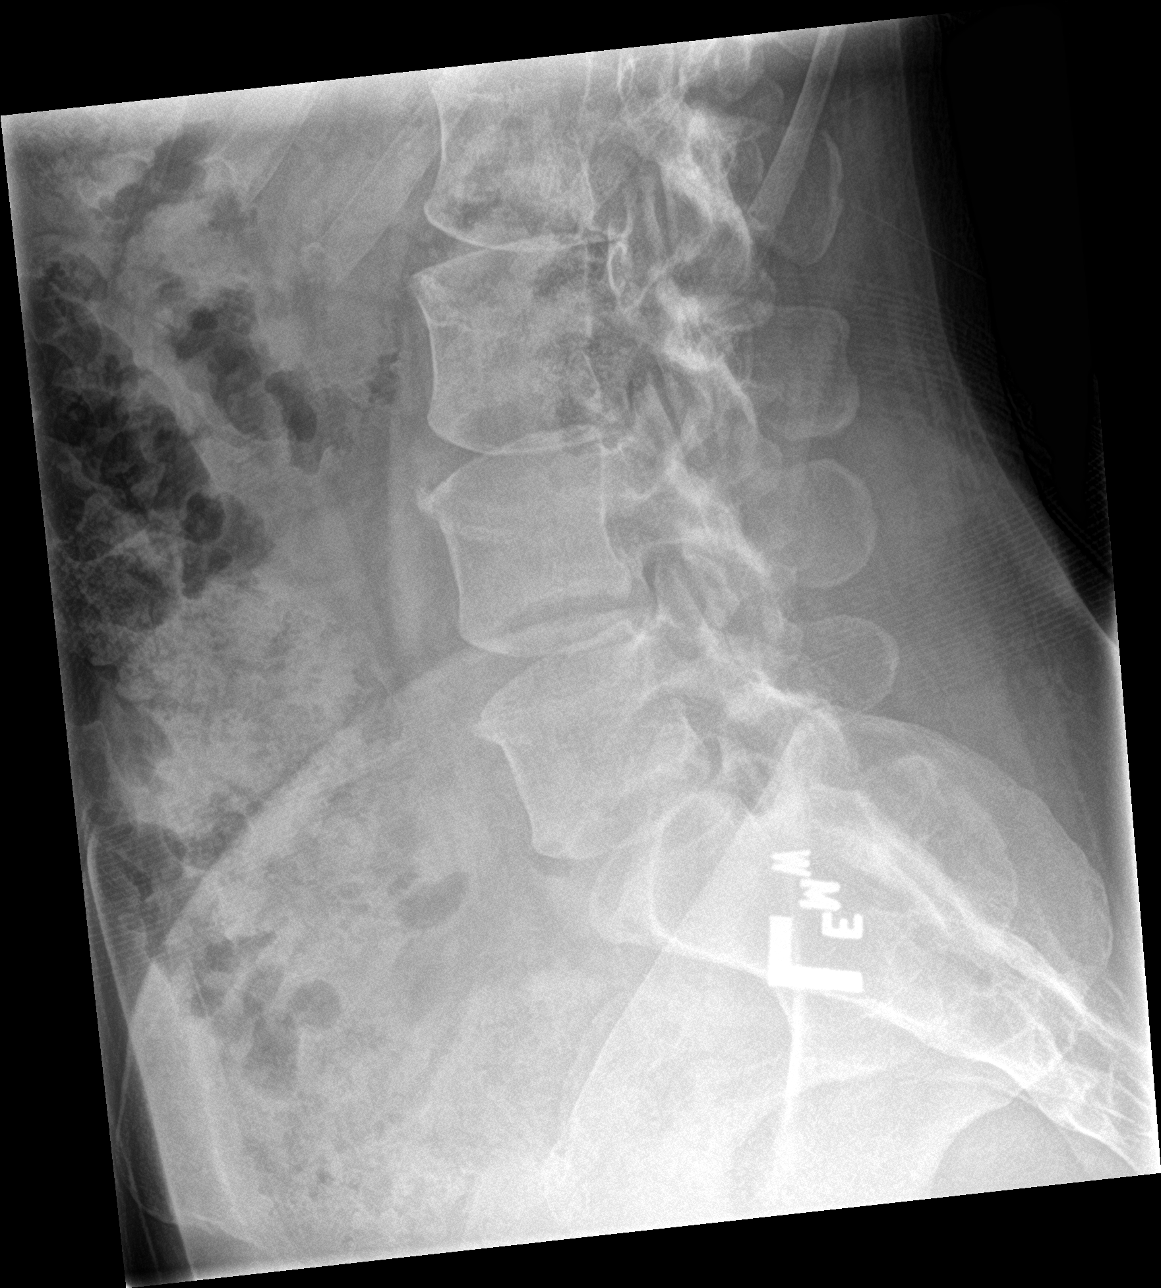

[5 of 5 positions shown; findings below may reference images not displayed]

FINDINGS: Frontal, lateral, spot lumbosacral lateral, and bilateral oblique
views were obtained. There are 5 non-rib-bearing lumbar type
vertebral bodies. There is no fracture or spondylolisthesis. Disc
spaces appear unremarkable. There are small anterior osteophytes at
L4 and L5. There is no appreciable facet arthropathy.
IMPRESSION: No fracture or spondylolisthesis. No appreciable disc space
narrowing or facet arthropathy.
